# Patient Record
Sex: Male | Born: 1950 | Race: Black or African American | Hispanic: No | Marital: Married | Smoking: Former smoker
Health system: Southern US, Community
[De-identification: ages and names within clinical notes are randomized; demographics above are authoritative.]

## PROBLEM LIST (undated history)

## (undated) DIAGNOSIS — K219 Gastro-esophageal reflux disease without esophagitis: Secondary | ICD-10-CM

## (undated) DIAGNOSIS — R32 Unspecified urinary incontinence: Secondary | ICD-10-CM

## (undated) DIAGNOSIS — N400 Enlarged prostate without lower urinary tract symptoms: Secondary | ICD-10-CM

## (undated) DIAGNOSIS — E7800 Pure hypercholesterolemia, unspecified: Secondary | ICD-10-CM

## (undated) DIAGNOSIS — E119 Type 2 diabetes mellitus without complications: Secondary | ICD-10-CM

## (undated) DIAGNOSIS — G4730 Sleep apnea, unspecified: Secondary | ICD-10-CM

## (undated) DIAGNOSIS — I10 Essential (primary) hypertension: Secondary | ICD-10-CM

## (undated) DIAGNOSIS — M1990 Unspecified osteoarthritis, unspecified site: Secondary | ICD-10-CM

## (undated) HISTORY — PX: HERNIA REPAIR: SHX51

## (undated) HISTORY — DX: Unspecified urinary incontinence: R32

## (undated) HISTORY — DX: Sleep apnea, unspecified: G47.30

## (undated) HISTORY — DX: Type 2 diabetes mellitus without complications: E11.9

## (undated) HISTORY — DX: Benign prostatic hyperplasia without lower urinary tract symptoms: N40.0

## (undated) HISTORY — DX: Gastro-esophageal reflux disease without esophagitis: K21.9

## (undated) HISTORY — DX: Essential (primary) hypertension: I10

## (undated) HISTORY — PX: COLONOSCOPY: SHX174

## (undated) HISTORY — DX: Unspecified osteoarthritis, unspecified site: M19.90

## (undated) HISTORY — DX: Pure hypercholesterolemia, unspecified: E78.00

---

## 1988-01-01 HISTORY — PX: AV FISTULA REPAIR: SHX563

## 2007-01-12 ENCOUNTER — Emergency Department: Payer: Self-pay

## 2010-09-25 ENCOUNTER — Ambulatory Visit: Payer: Self-pay | Admitting: Gastroenterology

## 2010-09-27 LAB — PATHOLOGY REPORT

## 2013-05-12 DIAGNOSIS — N529 Male erectile dysfunction, unspecified: Secondary | ICD-10-CM | POA: Insufficient documentation

## 2013-05-12 DIAGNOSIS — E291 Testicular hypofunction: Secondary | ICD-10-CM | POA: Insufficient documentation

## 2013-10-26 ENCOUNTER — Ambulatory Visit: Payer: Self-pay | Admitting: Gastroenterology

## 2013-10-27 ENCOUNTER — Ambulatory Visit: Payer: Self-pay | Admitting: Gastroenterology

## 2013-10-28 LAB — PATHOLOGY REPORT

## 2014-03-03 ENCOUNTER — Ambulatory Visit: Payer: Self-pay | Admitting: Cardiology

## 2014-03-09 ENCOUNTER — Encounter: Payer: Self-pay | Admitting: *Deleted

## 2014-03-10 ENCOUNTER — Encounter: Payer: Self-pay | Admitting: Cardiology

## 2014-03-10 ENCOUNTER — Ambulatory Visit (INDEPENDENT_AMBULATORY_CARE_PROVIDER_SITE_OTHER): Payer: No Typology Code available for payment source | Admitting: Cardiology

## 2014-03-10 ENCOUNTER — Encounter (INDEPENDENT_AMBULATORY_CARE_PROVIDER_SITE_OTHER): Payer: Self-pay

## 2014-03-10 VITALS — BP 131/88 | HR 68 | Ht 66.0 in | Wt 268.5 lb

## 2014-03-10 DIAGNOSIS — I509 Heart failure, unspecified: Secondary | ICD-10-CM

## 2014-03-10 DIAGNOSIS — R609 Edema, unspecified: Secondary | ICD-10-CM

## 2014-03-10 DIAGNOSIS — R079 Chest pain, unspecified: Secondary | ICD-10-CM

## 2014-03-10 DIAGNOSIS — R0989 Other specified symptoms and signs involving the circulatory and respiratory systems: Secondary | ICD-10-CM

## 2014-03-10 DIAGNOSIS — R0609 Other forms of dyspnea: Secondary | ICD-10-CM

## 2014-03-10 NOTE — Patient Instructions (Signed)
Your physician has requested that you have an echocardiogram. Echocardiography is a painless test that uses sound waves to create images of your heart. It provides your doctor with information about the size and shape of your heart and how well your heart's chambers and valves are working. This procedure takes approximately one hour. There are no restrictions for this procedure.  Your physician recommends that you schedule a follow-up appointment in:  2 weeks after echo

## 2014-03-13 ENCOUNTER — Encounter: Payer: Self-pay | Admitting: Cardiology

## 2014-03-13 DIAGNOSIS — Z6841 Body Mass Index (BMI) 40.0 and over, adult: Secondary | ICD-10-CM

## 2014-03-13 DIAGNOSIS — R0789 Other chest pain: Secondary | ICD-10-CM

## 2014-03-13 DIAGNOSIS — I509 Heart failure, unspecified: Secondary | ICD-10-CM | POA: Insufficient documentation

## 2014-03-13 DIAGNOSIS — R609 Edema, unspecified: Secondary | ICD-10-CM | POA: Insufficient documentation

## 2014-03-13 DIAGNOSIS — R0609 Other forms of dyspnea: Secondary | ICD-10-CM

## 2014-03-13 DIAGNOSIS — R079 Chest pain, unspecified: Secondary | ICD-10-CM | POA: Insufficient documentation

## 2014-03-13 NOTE — Assessment & Plan Note (Signed)
We will need to get to the bottom of his symptoms but also need to work on weight loss. He will need to take an exercise regimen also watch his dietary intake. We will evaluate his feasibility for doing exercise with the current symptoms with a Myoview stress test.

## 2014-03-13 NOTE — Progress Notes (Signed)
PATIENT: Gary Rowland MRN: [id number] DOB: 1951/12/11 PCP: Park PopeHAWKINS,JAMES H, MD  Clinic Note: Chief Complaint  Patient presents with  . OTHER    Ref by PCP c/o chest pain, sob and edema ankles. Meds reviewed verbally with pt.    HPI: Gary Rowland is a 63 y.o. male with a PMH below who presents today for the assessment of chest discomfort and exertional dyspnea along with edema. He has a past medical history of hypertension, type 2 diabetes and dyslipidemia..  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 He says his gain maybe 14 pounds and has noted increasing abdominal girth and leg swelling. He says he occasionally feels short of breath w[place] it but denies feeling short of breath laying down flat. He had an episode back in February where he was working out a treadmill the gym he started to get tightness in his chest with shortness of breath. Since then he's noted the symptoms occur with less activity and more intensity. It is point that he now gets short of breath just simply walking from the heart lot to a grocery store. If he goes any further than that he will start to get tightness in his chest. He denies any irregular or rapid heartbeats/palpitations. No lightheadedness, dizziness, wooziness, syncope/near syncope. No TIA or amaurosis fugax symptoms.  Describes it as chest of being like a burning sensation. The first episode lasted about 30 minutes and then subsided since then the episodes have been shorter. Denies any melena, hematochezia or hematuria. No claudication.  Past Medical History  Diagnosis Date  . Hypercholesteremia   . 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 Unspecified essential hypertension   . Unspecified urinary incontinence   . Type II or unspecified type diabetes mellitus without  mention of complication, not stated as uncontrolled   . Esophageal reflux     Prior Cardiac Evaluation and Past Surgical History: Past Surgical History  Procedure Laterality Date  . Colonoscopy    . Hernia repair    . Av fistula repair      No Known Allergies  Current Outpatient Prescriptions  Medication Sig Dispense Refill  . atorvastatin (LIPITOR) 10 MG tablet Take 10 mg by mouth daily.      Marland Kitchen. doxazosin (CARDURA) 8 MG tablet Take 8 mg by mouth daily.      . finasteride (PROSCAR) 5 MG tablet Take 5 mg by mouth daily.      Marland Kitchen. lisinopril (PRINIVIL,ZESTRIL) 20 MG tablet Take 20 mg by mouth daily.      . metFORMIN (GLUCOPHAGE) 500 MG tablet Take 500 mg by mouth daily with breakfast.      . oxybutynin (DITROPAN-XL) 10 MG 24 hr tablet Take 10 mg by mouth at bedtime.       No current facility-administered medications for this visit.    History   Social History Narrative   He is married. 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 He has some college education. He does note that these are poorly balanced diet. He is a former smoker but quit remotely. Only occasionally drinks alcohol.    family history includes Colon polyps in his mother; Heart attack in his father.  ROS: A comprehensive Review of Systems - Negative except Decreased energy and exercise tolerance with weight gain. Otherwise negative if not noted in history of present illness.  PHYSICAL EXAM BP 131/88  Pulse 68  Ht 5\' 6"  (1.676 m)  Wt 268 lb 8 oz (121.791 kg)  BMI 43.36 kg/m2 General appearance: alert, cooperative, appears stated age, no distress, moderately obese and morbidly obese Neck: no adenopathy, no carotid bruit, supple, symmetrical, trachea midline and Unable to determine any JVD due to body habitus Lungs: clear to auscultation bilaterally, normal percussion bilaterally and Nonlabored, good air movement Heart: normal apical impulse, regular rate and rhythm, S1, S2 normal, no S3 or S4 and Soft 1/6 SEM at RUSB. Abdomen:  soft, non-tender; bowel sounds normal; no masses,  no organomegaly and Obese Extremities: extremities normal, atraumatic, no cyanosis or edema and no ulcers, gangrene or trophic changes Pulses: 2+ and symmetric Skin: Skin color, texture, turgor normal. No rashes or lesions Neurologic: Alert and oriented X 3, normal strength and tone. Normal symmetric reflexes. Normal coordination and gait   Adult ECG Report  Rate: 68 ;  Rhythm: normal sinus rhythm  QRS Axis: 44 ;  PR Interval: 146 msec ;  QRS Duration: 90 msec ; QTc: 366 msec  Voltages: Normal  Conduction Disturbances: none  Other Abnormalities: Q wave noted in lead 3 with flat ST segments in 2 and V2 with mild T. inversions in 3, aVF, V3 through V6.   Narrative Interpretation: Normal sinus rhythm with mild nonspecific ST-T wave abnormalities.  Recent Labs: None Available  ASSESSMENT / PLAN: 63 year old gentleman with a history of diabetes mellitus type 2, hypertension, hyperlipidemia, obesity is a former smoker. He now presents with roughly a month of worsening exertional dyspnea with chest tightness. His most notable symptom for him is low semi-edema.. This is associated with edema. He is at least of intermediate risk of having a cardiac etiology. Therefore warrants evaluation with Myoview stress test. He also has what sounds like a systolic murmur that would be suggestive of aortic valve disease.  Edema He is currently on furosemide as well as an ACE inhibitor and statin. I'm not sure if his dyspnea could be related to his obesity versus potentially due to a cardiac etiology.  In association with PND and exertional dyspnea, cannot exclude cardiac etiology.   Plan: 2-D echocardiogram and Exercise (Treadmill) Myoview For now continue current medications. We'll reassess blood pressure upon Lesion.  Exertional dyspnea In a patient with multiple cardiac risk factors, if any concern for possible cardiac etiology. Plan is to Echocardiogram &   Exercise Myoview. For now continue current regimen as described. Would like to have more data with which to make decision as to to her symptoms. Some of the chest discomfort symptoms do sound a bit musculoskeletal in nature.   CHF (congestive heart failure) Cannot exclude possibility of CHF causing some of the edema. With the chest discomfort she was well, we'll order 2-D echocardiogram to assess EF and filling pressures.  Chest pain with moderate risk for cardiac etiology My initial thought was that we would evaluate with cardiac possible CHF, however when reviewing his symptoms, I was concerned with the presence of the chest discomfort as well. I think it probably best to contact the patient to ensure that a Myoview stress test can also be scheduled as soon as possible.  Severe obesity (BMI >= 40) We will need to get to the bottom of his symptoms but also need to work on weight loss. He will need to take an exercise regimen also watch his dietary intake. We will evaluate his feasibility for doing exercise with the current symptoms with a  Myoview stress test.    Orders Placed This Encounter  Procedures  . EKG 12-Lead    Order Specific Question:  Where should this test be performed    Answer:  [facility]  . 2D Echocardiogram without contrast    Standing Status: Future     Number of Occurrences:      Standing Expiration Date: 03/10/2015    Order Specific Question:  Type of Echo    Answer:  Complete    Order Specific Question:  Where should this test be performed    Answer:  [facility]    Order Specific Question:  Reason for exam-Echo    Answer:  Congestive Heart Failure  428.0   No orders of the defined types were placed in this encounter.    Followup: 2 weeks  Robby Pirani W. Herbie Baltimore, M.D., M.S. Interventional Cardiolgy [facility]

## 2014-03-13 NOTE — Assessment & Plan Note (Signed)
He is currently on furosemide as well as an ACE inhibitor and statin. I'm not sure if his dyspnea could be related to his obesity versus potentially due to a cardiac etiology.  In association with PND and exertional dyspnea, cannot exclude cardiac etiology.   Plan: 2-D echocardiogram and Exercise (Treadmill) Myoview For now continue current medications. We'll reassess blood pressure upon Lesion.

## 2014-03-13 NOTE — Assessment & Plan Note (Signed)
My initial thought was that we would evaluate with cardiac possible CHF, however when reviewing his symptoms, I was concerned with the presence of the chest discomfort as well. I think it probably best to contact the patient to ensure that a Myoview stress test can also be scheduled as soon as possible.

## 2014-03-13 NOTE — Assessment & Plan Note (Signed)
In a patient with multiple cardiac risk factors, if any concern for possible cardiac etiology. Plan is to Echocardiogram &  Exercise Myoview. For now continue current regimen as described. Would like to have more data with which to make decision as to to her symptoms. Some of the chest discomfort symptoms do sound a bit musculoskeletal in nature.

## 2014-03-13 NOTE — Assessment & Plan Note (Signed)
Cannot exclude possibility of CHF causing some of the edema. With the chest discomfort she was well, we'll order 2-D echocardiogram to assess EF and filling pressures.

## 2014-03-17 ENCOUNTER — Telehealth: Payer: Self-pay | Admitting: *Deleted

## 2014-03-17 DIAGNOSIS — R0602 Shortness of breath: Secondary | ICD-10-CM

## 2014-03-17 DIAGNOSIS — R079 Chest pain, unspecified: Secondary | ICD-10-CM

## 2014-03-17 NOTE — Telephone Encounter (Signed)
[facility] MYOVIEW  Your caregiver has ordered a Stress Test with nuclear imaging. The purpose of this test is to evaluate the blood supply to your heart muscle. This procedure is referred to as a "Non-Invasive Stress Test." This is because other than having an IV started in your vein, nothing is inserted or "invades" your body. Cardiac stress tests are done to find areas of poor blood flow to the heart by determining the extent of coronary artery disease (CAD). Some patients exercise on a treadmill, which naturally increases the blood flow to your heart, while others who are  unable to walk on a treadmill due to physical limitations have a pharmacologic/chemical stress agent called Lexiscan . This medicine will mimic walking on a treadmill by temporarily increasing your coronary blood flow.   Please note: these test may take anywhere between 2-4 hours to complete  PLEASE REPORT TO [facility]  THE VOLUNTEERS AT THE FIRST DESK WILL DIRECT YOU WHERE TO GO  Date of Procedure:______________03/23/15_______________  Arrival Time for Procedure:__________0745 am_____________  Instructions regarding medication:   __x__ : Hold diabetes medication morning of procedure   PLEASE NOTIFY THE OFFICE AT LEAST 24 HOURS IN ADVANCE IF YOU ARE UNABLE TO KEEP YOUR APPOINTMENT.  [phone] AND  PLEASE NOT[facility] AT LEAST 24 HOURS IN ADVANCE IF YOU ARE UNABLE TO KEEP YOUR APPOINTMENT. [phone]  How to prepare for your Myoview test:  1. Do not eat or drink after midnight 2. No caffeine for 24 hours prior to test 3. No smoking 24 hours prior to test. 4. Your medication may be taken with water.  If your doctor stopped a medication because of this test, do not take that medication. 5. Ladies, please do not wear dresses.  Skirts or pants are appropriate. Please wear a short sleeve shirt. 6. No perfume, cologne or lotion. 7. Wear comfortable walking shoes. No heels!   Reviewed  Myo instructions with patient. Patient verbalized understanding.

## 2014-03-17 NOTE — Telephone Encounter (Signed)
Myoview ordered after further consideration of his Sx.   See Note for Details.  Marykay LexHARDING,Teion Ballin W, MD

## 2014-03-19 ENCOUNTER — Other Ambulatory Visit: Payer: Self-pay

## 2014-03-19 ENCOUNTER — Other Ambulatory Visit (INDEPENDENT_AMBULATORY_CARE_PROVIDER_SITE_OTHER): Payer: No Typology Code available for payment source

## 2014-03-19 DIAGNOSIS — R079 Chest pain, unspecified: Secondary | ICD-10-CM

## 2014-03-19 DIAGNOSIS — R609 Edema, unspecified: Secondary | ICD-10-CM

## 2014-03-19 DIAGNOSIS — I509 Heart failure, unspecified: Secondary | ICD-10-CM

## 2014-03-19 HISTORY — PX: OTHER SURGICAL HISTORY: SHX169

## 2014-03-20 NOTE — Progress Notes (Signed)
Quick Note:  Echo results: Good news: Essentially normal echocardiogram and normal pump function and normal valve function. No signs to suggest heart attack.. EF: 60-65%. No regional wall motion abnormalities  HARDING,DAVID W, MD  ______ 

## 2014-03-22 ENCOUNTER — Ambulatory Visit: Payer: Self-pay

## 2014-03-22 DIAGNOSIS — R079 Chest pain, unspecified: Secondary | ICD-10-CM

## 2014-04-07 ENCOUNTER — Ambulatory Visit (INDEPENDENT_AMBULATORY_CARE_PROVIDER_SITE_OTHER): Payer: No Typology Code available for payment source | Admitting: Cardiology

## 2014-04-07 ENCOUNTER — Encounter: Payer: Self-pay | Admitting: Cardiology

## 2014-04-07 VITALS — BP 100/66 | HR 100 | Ht 66.0 in | Wt 264.0 lb

## 2014-04-07 DIAGNOSIS — R0989 Other specified symptoms and signs involving the circulatory and respiratory systems: Secondary | ICD-10-CM

## 2014-04-07 DIAGNOSIS — R079 Chest pain, unspecified: Secondary | ICD-10-CM

## 2014-04-07 DIAGNOSIS — R609 Edema, unspecified: Secondary | ICD-10-CM

## 2014-04-07 DIAGNOSIS — R0789 Other chest pain: Secondary | ICD-10-CM

## 2014-04-07 DIAGNOSIS — R0609 Other forms of dyspnea: Secondary | ICD-10-CM

## 2014-04-07 DIAGNOSIS — I951 Orthostatic hypotension: Secondary | ICD-10-CM

## 2014-04-07 DIAGNOSIS — Z6841 Body Mass Index (BMI) 40.0 and over, adult: Secondary | ICD-10-CM

## 2014-04-07 MED ORDER — LISINOPRIL 20 MG PO TABS: 10.0000 mg | ORAL_TABLET | Freq: Every day | ORAL | Status: DC

## 2014-04-07 NOTE — Patient Instructions (Signed)
Your physician has recommended you make the following change in your medication:  Decrease Lisinopril to 10 mg daily while on Cardura  If Cardura is stopped please let us know.   Please talk to PCP about side effects of Doxazosin, Finasteride, and Oxybutinin.   Your physician wants you to follow-up in: 1 year You will receive a reminder letter in the mail two months in advance. If you don't receive a letter, please call our office to schedule the follow-up appointment.

## 2014-04-09 ENCOUNTER — Encounter: Payer: Self-pay | Admitting: Cardiology

## 2014-04-09 DIAGNOSIS — I951 Orthostatic hypotension: Secondary | ICD-10-CM | POA: Insufficient documentation

## 2014-04-09 NOTE — Assessment & Plan Note (Signed)
It is quite likely the main causes of his dyspnea. We talked a long time about importance of dietary modification and exercise..Marland Kitchen

## 2014-04-09 NOTE — Assessment & Plan Note (Signed)
Overall improved, may very well be simply resulted from obesity. He did not have the Myoview ordered, since he is no longer having any chest discomfort, I think we will hold off on the Myoview based on the normal echocardiogram results. We will pursue a more detailed evaluation if his symptoms recur or worsen.

## 2014-04-09 NOTE — Assessment & Plan Note (Signed)
Patient about his blood pressure is only 100 systolic on evaluation today, I suspect he is orthostatic. I recommended he reduce his ACE inhibitor by half. It had been 20. I have asked him to talk to his primary physician because I think the Cardura could very well be the cause. As long as he is taking Cardura, think he needs to get his ACE inhibitor dose in half.  Am also concerned that she is having the decreased visual symptoms which could be a side effect of either doxazosin or finasteride.

## 2014-04-09 NOTE — Assessment & Plan Note (Signed)
He had been on furosemide, and is now not at furosemide but is on medicines for BPH. I'm reluctant to go back to her as mine since he is having issues with nocturia.  No clear cardiac etiology, on echocardiogram. There is no suggestion of elevated point pressures.

## 2014-04-09 NOTE — Progress Notes (Signed)
PATIENT: Gary Rowland MRN: [id number] DOB: 1951/12/02 PCP: Park Pope, MD  Clinic Note: Chief Complaint  Patient presents with  . OTHER    F/u from echo c/o edema ankles. Meds reviewed verbally with pt.     HPI: Gary Rowland is a 63 y.o. male with a PMH below who presents today for followup after initial presentation to assess chest discomfort and exertional dyspnea along with edema. He has a past medical history of hypertension, type 2 diabetes and dyslipidemia.. The main symptom he was noting was dyspnea and edema. Chest discomfort was a secondary concern, therefore we planned to do an echocardiogram initially, then reassess to see if we need to do a Myoview stress test. His echocardiogram was essentially normal with mild concentric hypertrophy an EF of 65% with no significant valve lesions.  Interval History: He returns today stating that his breathing is relatively stable, and he is not really having that much in way of any chest discomfort. He has lost about 4 pounds.  He was started on several medications for nocturia, has noted significant symptoms since starting them including edema, orthostatic hypotension, on decreased libido and sex drive. From a cardiac standpoint cc good doing relatively stable. He still has some mild exertional dyspnea but the chest discomfort is notably improved. He denies any rapid or irregular heartbeats, lightheadedness or dizziness with the exception of the orthostasis. He denies any syncope or near-syncope. No TIA or RCA symptoms. No melena, hematochezia or hematuria. No epistaxis. No claudication.  Past Medical History  Diagnosis Date  . Hypercholesteremia   . 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 Unspecified essential hypertension   . Unspecified urinary incontinence   . Type II or unspecified type diabetes mellitus without mention of complication, not stated as  uncontrolled   . Esophageal reflux     Prior Cardiac Evaluation and Past Surgical History: Past Surgical History  Procedure Laterality Date  . Colonoscopy    . Hernia repair    . Av fistula repair    . 2-d echocardiogram  03/19/2014    Mild concentric LVH with EF 60-65%. No significant valvular lesions and no suggestion of abnormal filling patterns.    No Known Allergies  Current Outpatient Prescriptions  Medication Sig Dispense Refill  . atorvastatin (LIPITOR) 10 MG tablet Take 10 mg by mouth daily.      Marland Kitchen doxazosin (CARDURA) 8 MG tablet Take 8 mg by mouth daily.      . finasteride (PROSCAR) 5 MG tablet Take 5 mg by mouth daily.      Marland Kitchen lisinopril (PRINIVIL,ZESTRIL) 20 MG tablet Take 1 tablets 20mg  total by mouth daily.      . metFORMIN (GLUCOPHAGE) 500 MG tablet Take 500 mg by mouth daily with breakfast.      . oxybutynin (DITROPAN-XL) 10 MG 24 hr tablet Take 10 mg by mouth at bedtime.       No current facility-administered medications for this visit.    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 He has some college education. He does note that these are poorly balanced diet. He is a former smoker but quit remotely. Only occasionally drinks alcohol.    ROS: A comprehensive Review of Systems - Negative except Decreased energy and exercise tolerance with weight gain. Otherwise negative if not noted in history of present illness.  PHYSICAL EXAM BP 100/66  Pulse  100  Ht 5\' 6"  (1.676 m)  Wt 264 lb (119.75 kg)  BMI 42.63 kg/m2 General appearance: alert, cooperative, appears stated age, no distress, moderately obese and morbidly obese Neck: no adenopathy, no carotid bruit, supple, symmetrical, trachea midline and Unable to determine any JVD due to body habitus Lungs: clear to auscultation bilaterally, normal percussion bilaterally and Nonlabored, good air movement Heart: normal apical impulse, regular rate and rhythm, S1, S2 normal, no  S3 or S4 and Soft 1/6 SEM at RUSB. Abdomen: soft, non-tender; bowel sounds normal; no masses,  no organomegaly and Obese Extremities: extremities normal, atraumatic, no cyanosis with trace to 1+ edema; no ulcers, gangrene or trophic changes Pulses: 2+ and symmetric Skin: Skin color, texture, turgor normal. No rashes or lesions Neurologic: Alert and oriented X 3, normal strength and tone. Normal symmetric reflexes. Normal coordination and gait   Adult ECG Report  -- not checked  Recent Labs: None Available; echo results in past medical history  ASSESSMENT / PLAN: 63 year old gentleman with a history of diabetes mellitus type 2, hypertension, hyperlipidemia, obesity is a former smoker.  He was noticing exertional dyspnea with some test tightness as well as lower extremity edema. Initial plan to order a stress test was continued upon his symptoms currently. As of this point, he notably denies any significant chest discomfort episodes since before his last visit. The echocardiogram did not provide any data to suggest a cardiac etiology for his symptoms.   Exertional dyspnea Overall improved, may very well be simply resulted from obesity. He did not have the Myoview ordered, since he is no longer having any chest discomfort, I think we will hold off on the Myoview based on the normal echocardiogram results. We will pursue a more detailed evaluation if his symptoms recur or worsen.  Morbid obesity with BMI of 50.0-59.9, adult It is quite likely the main causes of his dyspnea. We talked a long time about importance of dietary modification and exercise..  Chest pain of unknown etiology At this point, it does not appear to be symptoms related to heart failure as there is no sign of diastolic dysfunction. With no significant wall motion abnormalities, and looking to invoke ischemic etiology either. For now as above we will hold off on stress test unless symptoms recur or worsen.  We'll simply continue  current management.  Orthostatic hypotension Patient about his blood pressure is only 100 systolic on evaluation today, I suspect he is orthostatic. I recommended he reduce his ACE inhibitor by half. It had been 20. I have asked him to talk to his primary physician because I think the Cardura could very well be the cause. As long as he is taking Cardura, think he needs to get his ACE inhibitor dose in half.  Am also concerned that she is having the decreased visual symptoms which could be a side effect of either doxazosin or finasteride.  Edema He had been on furosemide, and is now not at furosemide but is on medicines for BPH. I'm reluctant to go back to her as mine since he is having issues with nocturia.  No clear cardiac etiology, on echocardiogram. There is no suggestion of elevated point pressures.    No orders of the defined types were placed in this encounter.   Meds ordered this encounter  Medications  . lisinopril (PRINIVIL,ZESTRIL) 20 MG tablet    Sig: Take 0.5 tablets (10 mg total) by mouth daily.    Followup: One year, unless symptoms worsen/recur  Lacosta Hargan W.  Herbie Baltimore, M.D., M.S. Interventional Cardiolgy [facility]

## 2014-04-09 NOTE — Assessment & Plan Note (Signed)
At this point, it does not appear to be symptoms related to heart failure as there is no sign of diastolic dysfunction. With no significant wall motion abnormalities, and looking to invoke ischemic etiology either. For now as above we will hold off on stress test unless symptoms recur or worsen.  We'll simply continue current management.

## 2014-12-15 DIAGNOSIS — E785 Hyperlipidemia, unspecified: Secondary | ICD-10-CM | POA: Insufficient documentation

## 2014-12-15 DIAGNOSIS — I10 Essential (primary) hypertension: Secondary | ICD-10-CM | POA: Insufficient documentation

## 2014-12-15 DIAGNOSIS — Z8639 Personal history of other endocrine, nutritional and metabolic disease: Secondary | ICD-10-CM | POA: Insufficient documentation

## 2015-06-15 ENCOUNTER — Ambulatory Visit: Payer: Self-pay

## 2015-08-24 ENCOUNTER — Ambulatory Visit
Admission: RE | Admit: 2015-08-24 | Discharge: 2015-08-24 | Disposition: A | Discharge status: Home / Self Care | Payer: Disability Insurance | Source: Ambulatory Visit | Attending: Ophthalmology | Admitting: Ophthalmology

## 2015-08-24 ENCOUNTER — Other Ambulatory Visit: Payer: Self-pay | Admitting: Ophthalmology

## 2015-08-24 DIAGNOSIS — M79642 Pain in left hand: Secondary | ICD-10-CM | POA: Diagnosis present

## 2015-08-24 DIAGNOSIS — M13811 Other specified arthritis, right shoulder: Secondary | ICD-10-CM | POA: Diagnosis not present

## 2015-08-24 DIAGNOSIS — M1990 Unspecified osteoarthritis, unspecified site: Secondary | ICD-10-CM

## 2015-08-24 DIAGNOSIS — M79641 Pain in right hand: Secondary | ICD-10-CM | POA: Diagnosis present

## 2015-08-24 DIAGNOSIS — M13842 Other specified arthritis, left hand: Secondary | ICD-10-CM | POA: Insufficient documentation

## 2015-08-24 DIAGNOSIS — M13841 Other specified arthritis, right hand: Secondary | ICD-10-CM | POA: Insufficient documentation

## 2015-08-24 DIAGNOSIS — M25511 Pain in right shoulder: Secondary | ICD-10-CM | POA: Diagnosis present

## 2015-08-31 ENCOUNTER — Other Ambulatory Visit: Payer: Self-pay | Admitting: Family Medicine

## 2015-08-31 DIAGNOSIS — R9389 Abnormal findings on diagnostic imaging of other specified body structures: Secondary | ICD-10-CM

## 2015-09-02 ENCOUNTER — Ambulatory Visit
Admission: RE | Admit: 2015-09-02 | Discharge: 2015-09-02 | Disposition: A | Discharge status: Home / Self Care | Payer: No Typology Code available for payment source | Source: Ambulatory Visit | Attending: Family Medicine | Admitting: Family Medicine

## 2015-09-02 DIAGNOSIS — R938 Abnormal findings on diagnostic imaging of other specified body structures: Secondary | ICD-10-CM | POA: Insufficient documentation

## 2015-09-02 DIAGNOSIS — I2510 Atherosclerotic heart disease of native coronary artery without angina pectoris: Secondary | ICD-10-CM | POA: Insufficient documentation

## 2015-09-02 DIAGNOSIS — R9389 Abnormal findings on diagnostic imaging of other specified body structures: Secondary | ICD-10-CM

## 2015-09-02 MED ORDER — IOHEXOL 350 MG/ML SOLN
75.0000 mL | Freq: Once | INTRAVENOUS | Status: AC | PRN
  Administered 2015-09-02: 75 mL via INTRAVENOUS

## 2015-09-26 ENCOUNTER — Telehealth: Payer: Self-pay | Admitting: *Deleted

## 2015-09-26 NOTE — Telephone Encounter (Signed)
lmov to make OD 3m fu

## 2015-11-03 ENCOUNTER — Encounter: Payer: Self-pay | Admitting: Urology

## 2015-11-03 ENCOUNTER — Ambulatory Visit (INDEPENDENT_AMBULATORY_CARE_PROVIDER_SITE_OTHER): Payer: No Typology Code available for payment source | Admitting: Urology

## 2015-11-03 VITALS — BP 123/75 | HR 69 | Ht 66.0 in | Wt 275.6 lb

## 2015-11-03 DIAGNOSIS — N401 Enlarged prostate with lower urinary tract symptoms: Secondary | ICD-10-CM | POA: Diagnosis not present

## 2015-11-03 DIAGNOSIS — R351 Nocturia: Secondary | ICD-10-CM | POA: Diagnosis not present

## 2015-11-03 DIAGNOSIS — N138 Other obstructive and reflux uropathy: Secondary | ICD-10-CM | POA: Insufficient documentation

## 2015-11-03 LAB — BLADDER SCAN AMB NON-IMAGING: SCAN RESULT: 36

## 2015-11-03 NOTE — Progress Notes (Signed)
11/03/2015 9:39 AM   Gary Rowland 12/13/1951 [id number]  Referring provider: Cyndie MullGloria Patricia Santayana, DO [address] [place], [place] [id number]  Chief Complaint  Patient presents with  . Benign Prostatic Hypertrophy    [facility]  . Nocturia    HPI: Patient is a 64 year old African-American male who is a previous patient of Dr.Stoioff who presents today to establish care with a local urologist.  His major complaint today is nocturia 3-4 times a night.  He had been on testosterone cypionate in the past, but he has not had an ejection since 2013. He is not interested in restarting this medication at this time.  BPH WITH LUTS His IPSS score today is 17, which is moderate lower urinary tract symptomatology. He is terrible with his quality life due to his urinary symptoms. His PVR is 36 mL.  His major complaint today nocturia x 3-4.  He has had these symptoms for several years.  He denies any dysuria, hematuria or suprapubic pain.   He currently taking finasteride and doxazosin.  He also denies any recent fevers, chills, nausea or vomiting.  He does not have a family history of PCa.      IPSS      11/03/15 0900       International Prostate Symptom Score   How often have you had the sensation of not emptying your bladder? More than half the time     How often have you had to urinate less than every two hours? Almost always     How often have you found you stopped and started again several times when you urinated? About half the time     How often have you found it difficult to postpone urination? Less than half the time     How often have you had a weak urinary stream? Not at All     How often have you had to strain to start urination? Not at All     How many times did you typically get up at night to urinate? 3 Times     Total IPSS Score 17     Quality of Life due to urinary symptoms   If you were to spend the rest of  your life with your urinary condition just the way it is now how would you feel about that? Terrible        Score:  1-7 Mild 8-19 Moderate 20-35 Severe    PMH: Past Medical History  Diagnosis Date  . Hypercholesteremia   . 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 Unspecified essential hypertension   . Unspecified urinary incontinence   . Esophageal reflux   . Arthritis   . Diabetes (HCC)   . Sleep apnea     Surgical History: Past Surgical History  Procedure Laterality Date  . Colonoscopy    . Hernia repair    . Av fistula repair  1989  . 2-d echocardiogram  03/19/2014    Mild concentric LVH with EF 60-65%. No significant valvular lesions and no suggestion of abnormal filling patterns.    Home Medications:    Medication List       This list is accurate as of: 11/03/15  9:39 AM.  Always use your most recent med list.               amLODipine 10 MG tablet  Commonly known as:  NORVASC  Take by mouth.     atorvastatin 10 MG tablet  Commonly known as:  LIPITOR  Take 10 mg by mouth daily.     doxazosin 8 MG tablet  Commonly known as:  CARDURA  Take 8 mg by mouth daily.     finasteride 5 MG tablet  Commonly known as:  PROSCAR  Take 5 mg by mouth daily.     lisinopril 20 MG tablet  Commonly known as:  PRINIVIL,ZESTRIL  Take 0.5 tablets (10 mg total) by mouth daily.     losartan 100 MG tablet  Commonly known as:  COZAAR  Take by mouth.     metFORMIN 500 MG tablet  Commonly known as:  GLUCOPHAGE  Take 500 mg by mouth daily with breakfast.     metoprolol succinate 100 MG 24 hr tablet  Commonly known as:  TOPROL-XL  Take 100 mg by mouth.     oxybutynin 10 MG 24 hr tablet  Commonly known as:  DITROPAN-XL  Take 10 mg by mouth at bedtime.     testosterone cypionate 200 MG/ML injection  Commonly known as:  DEPOTESTOSTERONE CYPIONATE  Frequency:Q2W   Dosage:0.0     Instructions:   Note:Dose: 200 MG/ML        Allergies: No Known Allergies  Family History: Family History  Problem Relation Age of Onset  . Colon polyps Mother   . Heart attack Father   . Kidney disease Neg Hx   . Prostate cancer Neg Hx     Social History:  reports that he has quit smoking. He does not have any smokeless tobacco history on file. He reports that he drinks alcohol. He reports that he does not use illicit drugs.  ROS: UROLOGY Frequent Urination?: Yes Hard to postpone urination?: Yes Burning/pain with urination?: No Get up at night to urinate?: Yes Leakage of urine?: Yes Urine stream starts and stops?: Yes Trouble starting stream?: No Do you have to strain to urinate?: Yes Blood in urine?: No Urinary tract infection?: No Sexually transmitted disease?: No Injury to kidneys or bladder?: No Painful intercourse?: No Weak stream?: No Erection problems?: Yes Penile pain?: No  Gastrointestinal Nausea?: No Vomiting?: No Indigestion/heartburn?: No Diarrhea?: No Constipation?: No  Constitutional Fever: No Night sweats?: No Weight loss?: No Fatigue?: No  Skin Skin rash/lesions?: No Itching?: No  Eyes Blurred vision?: No Double vision?: No  Ears/Nose/Throat Sore throat?: No Sinus problems?: No  Hematologic/Lymphatic Swollen glands?: No Easy bruising?: No  Cardiovascular Leg swelling?: No Chest pain?: No  Respiratory Cough?: No Shortness of breath?: No  Endocrine Excessive thirst?: No  Musculoskeletal Back pain?: No Joint pain?: No  Neurological Headaches?: No Dizziness?: No  Psychologic Depression?: No Anxiety?: No  Physical Exam: BP 123/75 mmHg  Pulse 69  Ht  (1.676 m)  Wt 275 lb 9.6 oz (125.011 kg)  BMI 44.50 kg/m2  Constitutional: Well nourished. Alert and oriented, No acute distress. HEENT: [place] AT, moist mucus membranes. Trachea midline, no masses. Cardiovascular: No clubbing, cyanosis, or edema. Respiratory: Normal  respiratory effort, no increased work of breathing. GI: Abdomen is soft, non tender, non distended, no abdominal masses. Liver and spleen not palpable.  No hernias appreciated.  Stool sample for occult testing is not indicated.   GU: No CVA tenderness.  No bladder fullness or masses.  Patient with circumcised phallus. Foreskin easily retracted  Urethral meatus is patent.  No penile discharge. No penile lesions or rashes. Scrotum without lesions, cysts,  rashes and/or edema.  Testicles are located scrotally bilaterally. No masses are appreciated in the testicles. Left and right epididymis are normal. Rectal: Patient with  normal sphincter tone. Anus and perineum without scarring or rashes. No rectal masses are appreciated. Prostate is approximately ~50 grams (could not palpated entire gland due to buttocks tissue), no nodules are appreciated. Seminal vesicles are normal. Skin: No rashes, bruises or suspicious lesions. Lymph: No cervical or inguinal adenopathy. Neurologic: Grossly intact, no focal deficits, moving all 4 extremities. Psychiatric: Normal mood and affect.  Laboratory Data: PSA History:  0.45 ng/mL on 05/18/2015  Pertinent Imaging: Results for ARIEL, WINGROVE (MRN [id number]) as of 11/03/2015 09:26  Ref. Range 11/03/2015 09:07  Scan Result Unknown 36    Assessment & Plan:    1. BPH (benign prostatic hyperplasia) with LUTS:  Patient's IPSS score is 17/6  His PVR 36 mL.  His DRE demonstrates benign enlargement.  He is on maximal medical therapy with finasteride and doxazosin.   He is still experiencing moderate LUTS.  Since he is emptying his bladder, this may be more of an overactive bladder issue.  I will start Myrbetriq 50 mg daily.  He will follow up in 3 weeks for a  PVR and an IPSS score.    2. Nocturia:   I explained to the patient that nocturia is often multi-factorial and difficult to treat.  Sleeping disorders, heart conditions and peripheral vascular disease, diabetes,   enlarged prostate or urethral stricture causing bladder outlet obstruction and/or certain medications.  The patient does not drink caffeine or alcohol.  He is also avoiding fluids after 6:00 in the evening and voiding just prior to bedtime.  He has been diagnosed with sleep apnea and sleeps with a CPAP machine, but the machine itself for repairs at this time.  Since he had an adequate response with the oxybutynin but could not tolerate dry mouth, I will have him try Myrbetriq 50 mg daily.  I have advised the patient of the side effects of Myrbetriq, such as: elevation in BP, urinary retention and/or HA.  He will RTC in 3 weeks for a PVR and an IPSS score.    - BLADDER SCAN AMB NON-IMAGING   Return in about 3 weeks (around 11/24/2015) for IPSS and PVR.  Michiel Cowboy, PA-C  [facility] [address], [address] [place], [place] [address] [phone]

## 2015-11-29 ENCOUNTER — Encounter: Payer: Self-pay | Admitting: Urology

## 2015-11-29 ENCOUNTER — Ambulatory Visit (INDEPENDENT_AMBULATORY_CARE_PROVIDER_SITE_OTHER): Payer: No Typology Code available for payment source | Admitting: Urology

## 2015-11-29 VITALS — BP 124/68 | HR 80 | Ht 66.0 in | Wt 279.3 lb

## 2015-11-29 DIAGNOSIS — N401 Enlarged prostate with lower urinary tract symptoms: Secondary | ICD-10-CM

## 2015-11-29 DIAGNOSIS — R351 Nocturia: Secondary | ICD-10-CM

## 2015-11-29 DIAGNOSIS — N138 Other obstructive and reflux uropathy: Secondary | ICD-10-CM

## 2015-11-29 LAB — BLADDER SCAN AMB NON-IMAGING: SCAN RESULT: 28

## 2015-11-29 MED ORDER — OXYBUTYNIN CHLORIDE 5 MG PO TABS: 5.0000 mg | ORAL_TABLET | Freq: Three times a day (TID) | ORAL | Status: DC

## 2015-11-29 NOTE — Progress Notes (Signed)
9:18 AM   Gary Rowland [birth year] [id number]  Referring provider: Cyndie MullGloria Patricia Santayana, DO [address] [place], [place] [id number]  Chief Complaint  Patient presents with  . Benign Prostatic Hypertrophy    3 week recheck  . Nocturia    HPI: Patient is a 64 year old African-American male who is a previous patient of Dr.Stoioff who presents today to discuss his response to the Myrbetriq 50 mg.  His major complaint today is still  nocturia 3-4 times a night.  He had been on testosterone cypionate in the past, but he has not had an ejection since 2013. He is not interested in restarting this medication at this time.  BPH WITH LUTS His IPSS score today is 13, which is moderate lower urinary tract symptomatology. He is mostly dissatisfied with his quality life due to his urinary symptoms. His PVR is 28 mL.  His major complaint today nocturia x 3-4.  The Myrbetriq 50 mg nightly did not provide relief.  With further questioning, he is not using his CPAP machine consistently and it needs to be serviced.  He has had these symptoms for several years.  He denies any dysuria, hematuria or suprapubic pain.   He currently taking finasteride and doxazosin.  He also denies any recent fevers, chills, nausea or vomiting.  He does not have a family history of PCa.      IPSS      11/03/15 0900 11/29/15 0800     International Prostate Symptom Score   How often have you had the sensation of not emptying your bladder? More than half the time About half the time    How often have you had to urinate less than every two hours? Almost always Almost always    How often have you found you stopped and started again several times when you urinated? About half the time Not at All    How often have you found it difficult to postpone urination? Less than half the time Not at All    How often have you had a weak urinary stream? Not at All Not at All    How often have you had to strain to start urination? Not  at All Not at All    How many times did you typically get up at night to urinate? 3 Times 5 Times    Total IPSS Score 17 13    Quality of Life due to urinary symptoms   If you were to spend the rest of your life with your urinary condition just the way it is now how would you feel about that? Terrible Mostly Disatisfied       Score:  1-7 Mild 8-19 Moderate 20-35 Severe    PMH: Past Medical History  Diagnosis Date  . Hypercholesteremia   . 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 Unspecified essential hypertension   . Unspecified urinary incontinence   . Esophageal reflux   . Arthritis   . Diabetes (HCC)   . Sleep apnea     Surgical History: Past Surgical History  Procedure Laterality Date  . Colonoscopy    . Hernia repair    . Av fistula repair  1989  . 2-d echocardiogram  03/19/2014    Mild concentric LVH with EF 60-65%. No significant valvular lesions and no suggestion of abnormal filling patterns.    Home Medications:    Medication List  This list is accurate as of: 11/29/15  9:18 AM.  Always use your most recent med list.               amLODipine 10 MG tablet  Commonly known as:  NORVASC  Take by mouth.     atorvastatin 10 MG tablet  Commonly known as:  LIPITOR  Take 10 mg by mouth daily.     doxazosin 8 MG tablet  Commonly known as:  CARDURA  Take 8 mg by mouth daily.     finasteride 5 MG tablet  Commonly known as:  PROSCAR  Take 5 mg by mouth daily.     lisinopril 20 MG tablet  Commonly known as:  PRINIVIL,ZESTRIL  Take 0.5 tablets (10 mg total) by mouth daily.     losartan 100 MG tablet  Commonly known as:  COZAAR  Take by mouth.     metFORMIN 500 MG tablet  Commonly known as:  GLUCOPHAGE  Take 500 mg by mouth daily with breakfast.     metoprolol succinate 100 MG 24 hr tablet  Commonly known as:  TOPROL-XL  Take 100 mg by mouth.     oxybutynin 5 MG  tablet  Commonly known as:  DITROPAN  Take 1 tablet (5 mg total) by mouth 3 (three) times daily.     testosterone cypionate 200 MG/ML injection  Commonly known as:  DEPOTESTOSTERONE CYPIONATE  Frequency:Q2W   Dosage:0.0     Instructions:  Note:Dose: 200 MG/ML        Allergies: No Known Allergies  Family History: Family History  Problem Relation Age of Onset  . Colon polyps Mother   . Heart attack Father   . Kidney disease Neg Hx   . Prostate cancer Neg Hx     Social History:  reports that he has quit smoking. He does not have any smokeless tobacco history on file. He reports that he drinks alcohol. He reports that he does not use illicit drugs.  ROS: UROLOGY Frequent Urination?: Yes Hard to postpone urination?: No Burning/pain with urination?: No Get up at night to urinate?: Yes Leakage of urine?: No Urine stream starts and stops?: No Trouble starting stream?: No Do you have to strain to urinate?: No Blood in urine?: No Urinary tract infection?: No Sexually transmitted disease?: No Injury to kidneys or bladder?: No Painful intercourse?: No Weak stream?: No Erection problems?: Yes Penile pain?: No  Gastrointestinal Nausea?: No Vomiting?: No Indigestion/heartburn?: No Diarrhea?: No Constipation?: No  Constitutional Fever: No Night sweats?: No Weight loss?: No Fatigue?: No  Skin Skin rash/lesions?: No Itching?: No  Eyes Blurred vision?: Yes Double vision?: No  Ears/Nose/Throat Sore throat?: No Sinus problems?: No  Hematologic/Lymphatic Swollen glands?: No Easy bruising?: No  Cardiovascular Leg swelling?: Yes Chest pain?: No  Respiratory Cough?: No Shortness of breath?: No  Endocrine Excessive thirst?: No  Musculoskeletal Back pain?: No Joint pain?: Yes  Neurological Headaches?: No Dizziness?: No  Psychologic Depression?: No Anxiety?: No  Physical Exam: BP 124/68 mmHg  Pulse 80  Ht  (1.676 m)  Wt 279 lb 4.8 oz (126.69  kg)  BMI 45.10 kg/m2  Constitutional: Well nourished. Alert and oriented, No acute distress. HEENT: [place] AT, moist mucus membranes. Trachea midline, no masses. Cardiovascular: No clubbing, cyanosis, or edema. Respiratory: Normal respiratory effort, no increased work of breathing. Skin: No rashes, bruises or suspicious lesions. Lymph: No cervical or inguinal adenopathy. Neurologic: Grossly intact, no focal deficits, moving all 4 extremities. Psychiatric: Normal mood and  affect.  Laboratory Data: PSA History:  0.45 ng/mL on 05/18/2015  Pertinent Imaging: Results for VALDIS, BEVILL (MRN [id number]) as of 11/29/2015 09:08  Ref. Range 11/29/2015 08:57  Scan Result Unknown 28   Assessment & Plan:    1. BPH (benign prostatic hyperplasia) with LUTS:  Patient's IPSS score is 13/4.  His PVR 28 mL.  His DRE demonstrates benign enlargement.  He is on maximal medical therapy with finasteride and doxazosin.   He is still experiencing moderate LUTS.  Since he is emptying his bladder, this may be more of an overactive bladder issue.  He did not respond to the Myrbetriq.  He's had some success with oxybutynin in the past.  I will send that medication to his pharmacy.   He will follow up in 6 weeks for a  PVR and an IPSS score.    2. Nocturia:   I explained to the patient that nocturia is often multi-factorial and difficult to treat.  Sleeping disorders, heart conditions and peripheral vascular disease, diabetes,  enlarged prostate or urethral stricture causing bladder outlet obstruction and/or certain medications.  The patient does not drink caffeine or alcohol.  He is also avoiding fluids after 6:00 in the evening and voiding just prior to bedtime.  He has been diagnosed with sleep apnea and has not been sleeping with a CPAP machine.  It is in need of repairs.  I have encouraged him to contact the company again to get the machine serviced.   Since he had an adequate response with the oxybutynin, I will re  prescribe that medication.    He will RTC in 6 weeks for a PVR and an IPSS score.    - BLADDER SCAN AMB NON-IMAGING   Return in about 6 weeks (around 01/10/2016) for IPSS score and PVR.  Michiel Cowboy, PA-C  [facility] [address] [place], [place] [address] [phone]

## 2015-12-03 ENCOUNTER — Other Ambulatory Visit: Payer: Self-pay | Admitting: Family Medicine

## 2015-12-09 ENCOUNTER — Other Ambulatory Visit: Payer: Self-pay | Admitting: Family Medicine

## 2015-12-15 ENCOUNTER — Other Ambulatory Visit: Payer: Self-pay | Admitting: Family Medicine

## 2016-01-10 ENCOUNTER — Encounter: Payer: Self-pay | Admitting: Urology

## 2016-01-10 ENCOUNTER — Ambulatory Visit (INDEPENDENT_AMBULATORY_CARE_PROVIDER_SITE_OTHER): Payer: BLUE CROSS/BLUE SHIELD | Admitting: Urology

## 2016-01-10 VITALS — BP 143/69 | HR 79 | Ht 66.0 in | Wt 276.7 lb

## 2016-01-10 DIAGNOSIS — N401 Enlarged prostate with lower urinary tract symptoms: Secondary | ICD-10-CM | POA: Diagnosis not present

## 2016-01-10 DIAGNOSIS — Z8639 Personal history of other endocrine, nutritional and metabolic disease: Secondary | ICD-10-CM | POA: Diagnosis not present

## 2016-01-10 DIAGNOSIS — R351 Nocturia: Secondary | ICD-10-CM | POA: Diagnosis not present

## 2016-01-10 DIAGNOSIS — N138 Other obstructive and reflux uropathy: Secondary | ICD-10-CM

## 2016-01-10 LAB — BLADDER SCAN AMB NON-IMAGING: Scan Result: 15

## 2016-01-10 NOTE — Progress Notes (Signed)
10:06 AM   Gary SitesRaymond L Rowland [birth year] [id number]  Referring provider: Cyndie MullGloria Patricia Santayana, DO [address] [place], [place] [id number]  Chief Complaint  Patient presents with  . Benign Prostatic Hypertrophy    6 week recheck  . Nocturia    HPI: Patient is a 65 year old African-American male who is a previous patient of Dr.Stoioff who presents today to discuss his response to the oxybutynin 5 mg three time daily for his nocturia.   He also has a history of hypogonadism and BPH with LUTS.     Nocturia Patient has had his CPAP machine serviced and has been using it consistently over the last week.  He also was changed from amlodipine to HCTZ which has reduced his peripheral edema.  He is also taking oxybutynin 5 mg tid.  He is not experiencing any untoward side effects.  He is now getting up two times nightly rather than 4 times nightly.    History of hypogonadism He had been on testosterone cypionate in the past, but he has not had an ejection since 2013. He is not interested in restarting this medication at this time.  BPH WITH LUTS His IPSS score today is 4, which is mild lower urinary tract symptomatology. He is mostly satisfied with his quality life due to his urinary symptoms. His PVR is 15 mL.  His nocturia has reduced from 4 times nightly to 2 times nightly.  He denies any dysuria, hematuria or suprapubic pain.   He currently taking finasteride and doxazosin.  He also denies any recent fevers, chills, nausea or vomiting.  He does not have a family history of PCa.      IPSS      11/29/15 0800 01/10/16 0900     International Prostate Symptom Score   How often have you had the sensation of not emptying your bladder? About half the time Less than 1 in 5    How often have you had to urinate less than every two hours? Almost always Less than 1 in 5 times    How often have you found you stopped and started again several times when you urinated? Not at All Not at All    How  often have you found it difficult to postpone urination? Not at All Not at All    How often have you had a weak urinary stream? Not at All Not at All    How often have you had to strain to start urination? Not at All Not at All    How many times did you typically get up at night to urinate? 5 Times 2 Times    Total IPSS Score 13 4    Quality of Life due to urinary symptoms   If you were to spend the rest of your life with your urinary condition just the way it is now how would you feel about that? Mostly Disatisfied Mostly Satisfied       Score:  1-7 Mild 8-19 Moderate 20-35 Severe    PMH: Past Medical History  Diagnosis Date  . Hypercholesteremia   . 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 Unspecified essential hypertension   . Unspecified urinary incontinence   . Esophageal reflux   . Arthritis   . Diabetes (HCC)   . Sleep apnea     Surgical History: Past Surgical History  Procedure Laterality Date  . Colonoscopy    . Hernia repair    .  Av fistula repair  1989  . 2-d echocardiogram  03/19/2014    Mild concentric LVH with EF 60-65%. No significant valvular lesions and no suggestion of abnormal filling patterns.    Home Medications:    Medication List       This list is accurate as of: 01/10/16 10:06 AM.  Always use your most recent med list.               amLODipine 10 MG tablet  Commonly known as:  NORVASC  Take by mouth. Reported on 01/10/2016     atorvastatin 10 MG tablet  Commonly known as:  LIPITOR  TAKE 1 TABLET EVERY EVENING     doxazosin 8 MG tablet  Commonly known as:  CARDURA  TAKE 1 TABLET DAILY     finasteride 5 MG tablet  Commonly known as:  PROSCAR  Take 5 mg by mouth daily.     hydrochlorothiazide 25 MG tablet  Commonly known as:  HYDRODIURIL  Take by mouth.     losartan 100 MG tablet  Commonly known as:  COZAAR  TAKE 1 TABLET DAILY     metFORMIN 500 MG  tablet  Commonly known as:  GLUCOPHAGE  Take 500 mg by mouth daily with breakfast.     metoprolol succinate 100 MG 24 hr tablet  Commonly known as:  TOPROL-XL  Take 100 mg by mouth.     oxybutynin 5 MG tablet  Commonly known as:  DITROPAN  Take 1 tablet (5 mg total) by mouth 3 (three) times daily.     testosterone cypionate 200 MG/ML injection  Commonly known as:  DEPOTESTOSTERONE CYPIONATE  Reported on 01/10/2016        Allergies: No Known Allergies  Family History: Family History  Problem Relation Age of Onset  . Colon polyps Mother   . Heart attack Father   . Kidney disease Neg Hx   . Prostate cancer Neg Hx     Social History:  reports that he has quit smoking. He does not have any smokeless tobacco history on file. He reports that he drinks alcohol. He reports that he does not use illicit drugs.  ROS: UROLOGY Frequent Urination?: No Hard to postpone urination?: No Burning/pain with urination?: No Get up at night to urinate?: No Leakage of urine?: No Urine stream starts and stops?: No Trouble starting stream?: No Do you have to strain to urinate?: No Blood in urine?: No Urinary tract infection?: No Sexually transmitted disease?: No Injury to kidneys or bladder?: No Painful intercourse?: No Weak stream?: No Erection problems?: Yes Penile pain?: No  Gastrointestinal Nausea?: No Vomiting?: No Indigestion/heartburn?: No Diarrhea?: No Constipation?: No  Constitutional Fever: No Night sweats?: No Weight loss?: No Fatigue?: No  Skin Skin rash/lesions?: No Itching?: No  Eyes Blurred vision?: No Double vision?: No  Ears/Nose/Throat Sore throat?: No Sinus problems?: No  Hematologic/Lymphatic Swollen glands?: No Easy bruising?: No  Cardiovascular Leg swelling?: Yes Chest pain?: No  Respiratory Cough?: No Shortness of breath?: No  Endocrine Excessive thirst?: No  Musculoskeletal Back pain?: No Joint pain?:  No  Neurological Headaches?: No Dizziness?: No  Psychologic Depression?: No Anxiety?: No  Physical Exam: BP 143/69 mmHg  Pulse 79  Ht 5\' 6"  (1.676 m)  Wt 276 lb 11.2 oz (125.51 kg)  BMI 44.68 kg/m2  Constitutional: Well nourished. Alert and oriented, No acute distress. HEENT: [place] AT, moist mucus membranes. Trachea midline, no masses. Cardiovascular: No clubbing, cyanosis, or edema. Respiratory: Normal respiratory effort, no  increased work of breathing. Skin: No rashes, bruises or suspicious lesions. Lymph: No cervical or inguinal adenopathy. Neurologic: Grossly intact, no focal deficits, moving all 4 extremities. Psychiatric: Normal mood and affect.  Laboratory Data: PSA History:  0.45 ng/mL on 05/18/2015  Pertinent Imaging: Results for RYOSUKE, ERICKSEN (MRN [id number]) as of 11/29/2015 09:08  Ref. Range 11/29/2015 08:57  Scan Result Unknown 28   Results for KELBY, ADELL (MRN [id number]) as of 01/10/2016 10:06  Ref. Range 01/10/2016 10:03  Scan Result Unknown 15   Assessment & Plan:    1. BPH (benign prostatic hyperplasia) with LUTS:  Patient's IPSS score is 4/2.  His PVR 15 mL.  He had a poor response to Myrbetriq.  He has noticed reduction in his nocturia from 4 times nightly to 2 times nightly.  He will continue the oxybutynin, finasteride and doxazosin.  He will follow up in May for I PSS score, PVR, exam and PSA.  2. Nocturia:   Patient has noticed an improvement in his nocturia since the use of a CPAP machine, changes in his blood pressure medication and the addition of the oxybutynin.  He will continue these measures.  He will follow up in May for I PSS score, PVR, exam and PSA.  - BLADDER SCAN AMB NON-IMAGING  3. History of hypogonadism:   He had been on testosterone cypionate in the past, but he has not had an ejection since 2013. He is not interested in restarting this medication at this time.  Return in about 4 months (around 05/09/2016) for IPSS, PVR and  exam.  Michiel Cowboy, [facility]  [facility] [address], [address] [place], [place] [address] [phone]

## 2016-02-21 DIAGNOSIS — E7800 Pure hypercholesterolemia, unspecified: Secondary | ICD-10-CM | POA: Insufficient documentation

## 2016-02-21 DIAGNOSIS — E119 Type 2 diabetes mellitus without complications: Secondary | ICD-10-CM | POA: Insufficient documentation

## 2016-02-21 DIAGNOSIS — G4733 Obstructive sleep apnea (adult) (pediatric): Secondary | ICD-10-CM | POA: Insufficient documentation

## 2016-02-21 DIAGNOSIS — Z9989 Dependence on other enabling machines and devices: Secondary | ICD-10-CM

## 2016-03-02 ENCOUNTER — Other Ambulatory Visit: Payer: Self-pay | Admitting: Family Medicine

## 2016-05-02 ENCOUNTER — Other Ambulatory Visit: Payer: BLUE CROSS/BLUE SHIELD

## 2016-05-09 ENCOUNTER — Ambulatory Visit: Payer: BLUE CROSS/BLUE SHIELD | Admitting: Urology

## 2016-05-09 ENCOUNTER — Other Ambulatory Visit: Payer: BLUE CROSS/BLUE SHIELD

## 2016-08-24 ENCOUNTER — Encounter: Payer: Self-pay | Admitting: Nurse Practitioner

## 2016-08-24 ENCOUNTER — Encounter: Payer: Self-pay | Admitting: Cardiology

## 2016-08-24 NOTE — Telephone Encounter (Signed)
Technician from [facility] called, states that pt stress test is positve and needs an appointment.   This encounter was created in error - please disregard.  This encounter was created in error - please disregard.  This encounter was created in error - please disregard.

## 2016-08-27 ENCOUNTER — Ambulatory Visit: Payer: No Typology Code available for payment source | Admitting: Nurse Practitioner

## 2016-10-01 ENCOUNTER — Encounter: Payer: Self-pay | Admitting: Urology

## 2016-10-01 ENCOUNTER — Ambulatory Visit (INDEPENDENT_AMBULATORY_CARE_PROVIDER_SITE_OTHER): Payer: BLUE CROSS/BLUE SHIELD | Admitting: Urology

## 2016-10-01 VITALS — BP 114/68 | HR 74 | Ht 68.0 in | Wt 262.7 lb

## 2016-10-01 DIAGNOSIS — R351 Nocturia: Secondary | ICD-10-CM | POA: Diagnosis not present

## 2016-10-01 DIAGNOSIS — N401 Enlarged prostate with lower urinary tract symptoms: Secondary | ICD-10-CM

## 2016-10-01 DIAGNOSIS — N138 Other obstructive and reflux uropathy: Secondary | ICD-10-CM | POA: Diagnosis not present

## 2016-10-01 LAB — URINALYSIS, COMPLETE
Bilirubin, UA: NEGATIVE
Glucose, UA: NEGATIVE
Ketones, UA: NEGATIVE
Leukocytes, UA: NEGATIVE
NITRITE UA: NEGATIVE
PH UA: 6.5 (ref 5.0–7.5)
PROTEIN UA: NEGATIVE
RBC, UA: NEGATIVE
Specific Gravity, UA: 1.015 (ref 1.005–1.030)
UUROB: 0.2 mg/dL (ref 0.2–1.0)

## 2016-10-01 LAB — BLADDER SCAN AMB NON-IMAGING: SCAN RESULT: 0

## 2016-10-01 LAB — MICROSCOPIC EXAMINATION
BACTERIA UA: NONE SEEN
WBC UA: NONE SEEN /HPF (ref 0–?)

## 2016-10-01 MED ORDER — OXYBUTYNIN CHLORIDE 5 MG PO TABS: 5.0000 mg | ORAL_TABLET | Freq: Three times a day (TID) | ORAL | 12 refills | Status: AC

## 2016-10-01 NOTE — Progress Notes (Signed)
10:49 AM   Gary Rowland 09/17/1951 [id number]  Referring provider: Cyndie Mull, DO [address] [place], [place] [address]  Chief Complaint  Patient presents with  . Benign Prostatic Hypertrophy    follow up been 9 months patient was supposed to follow up at 4 mo  . Nocturia    HPI: Patient is a 65 year old African-American male who is a previous patient of Dr.Stoioff who presents today for an overdue appointment for BPH with LUTS and nocturia.      Nocturia Patient has had his CPAP machine serviced and has been using it consistently.  He also was changed from amlodipine to HCTZ which has reduced his peripheral edema.  He is taking only one oxybutynin 5 mg daily,   He is not experiencing any untoward side effects.  He is getting up 2 to 3 times nightly.      BPH WITH LUTS His IPSS score today is 20, which is severe lower urinary tract symptomatology.  He is unhappy with his quality life due to his urinary symptoms.   His PVR is 0 mL at today's exam.  His previous IPSS score was 4/2.  His previous PVR was 15 mL.  His nocturia has returned to 4 times nightly.  He denies any dysuria, hematuria or suprapubic pain.   He currently taking finasteride and doxazosin.  He is only taking the oxybutynin 5 mg once daily.  He also denies any recent fevers, chills, nausea or vomiting.  He does not have a family history of PCa.      IPSS    Row Name 10/01/16 1000         International Prostate Symptom Score   How often have you had the sensation of not emptying your bladder? More than half the time     How often have you had to urinate less than every two hours? Almost always     How often have you found you stopped and started again several times when you urinated? More than half the time     How often have you found it difficult to postpone urination? Less than half the time     How often have you had a weak urinary stream? Less than 1 in 5 times     How often have you had  to strain to start urination? Less than 1 in 5 times     How many times did you typically get up at night to urinate? 3 Times     Total IPSS Score 20       Quality of Life due to urinary symptoms   If you were to spend the rest of your life with your urinary condition just the way it is now how would you feel about that? Unhappy        Score:  1-7 Mild 8-19 Moderate 20-35 Severe    PMH: Past Medical History:  Diagnosis Date  . Arthritis   . Diabetes (HCC)   . Esophageal reflux   . Hypercholesteremia   . Sleep apnea   . Unspecified essential hypertension   . 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 Unspecified urinary incontinence     Surgical History: Past Surgical History:  Procedure Laterality Date  . 2-D echocardiogram  03/19/2014   Mild concentric LVH with EF 60-65%. No significant valvular lesions and no suggestion of abnormal filling patterns.  . AV FISTULA REPAIR  1989  . COLONOSCOPY    . HERNIA REPAIR      Home Medications:    Medication List       Accurate as of 10/01/16 10:49 AM. Always use your most recent med list.          atorvastatin 10 MG tablet Commonly known as:  LIPITOR TAKE 1 TABLET EVERY EVENING   doxazosin 8 MG tablet Commonly known as:  CARDURA TAKE 1 TABLET DAILY   finasteride 5 MG tablet Commonly known as:  PROSCAR Take 5 mg by mouth daily.   hydrochlorothiazide 25 MG tablet Commonly known as:  HYDRODIURIL Take by mouth.   losartan 100 MG tablet Commonly known as:  COZAAR TAKE 1 TABLET DAILY   metFORMIN 500 MG tablet Commonly known as:  GLUCOPHAGE Take 500 mg by mouth daily with breakfast.   metoprolol succinate 100 MG 24 hr tablet Commonly known as:  TOPROL-XL Take 100 mg by mouth.   oxybutynin 5 MG tablet Commonly known as:  DITROPAN Take 1 tablet (5 mg total) by mouth 3 (three) times daily.   testosterone cypionate 200 MG/ML  injection Commonly known as:  DEPOTESTOSTERONE CYPIONATE Frequency:Q2W   Dosage:0.0     Instructions:  Note:Dose: 200 MG/ML       Allergies: No Known Allergies  Family History: Family History  Problem Relation Age of Onset  . Colon polyps Mother   . Heart attack Father   . Kidney disease Neg Hx   . Prostate cancer Neg Hx     Social History:  reports that he has quit smoking. He has never used smokeless tobacco. He reports that he drinks alcohol. He reports that he does not use drugs.  ROS: UROLOGY Frequent Urination?: Yes Hard to postpone urination?: No Burning/pain with urination?: No Get up at night to urinate?: Yes Leakage of urine?: No Urine stream starts and stops?: No Trouble starting stream?: No Do you have to strain to urinate?: No Blood in urine?: No Urinary tract infection?: No Sexually transmitted disease?: No Injury to kidneys or bladder?: No Painful intercourse?: No Weak stream?: No Erection problems?: Yes Penile pain?: Yes  Gastrointestinal Nausea?: No Vomiting?: No Indigestion/heartburn?: Yes Diarrhea?: Yes Constipation?: No  Constitutional Fever: No Night sweats?: No Weight loss?: No Fatigue?: No  Skin Skin rash/lesions?: No Itching?: No  Eyes Blurred vision?: Yes Double vision?: No  Ears/Nose/Throat Sore throat?: No Sinus problems?: No  Hematologic/Lymphatic Swollen glands?: No Easy bruising?: No  Cardiovascular Leg swelling?: Yes Chest pain?: No  Respiratory Cough?: No Shortness of breath?: No  Endocrine Excessive thirst?: No  Musculoskeletal Back pain?: No Joint pain?: Yes  Neurological Headaches?: No Dizziness?: No  Psychologic Depression?: No Anxiety?: No  Physical Exam: BP 114/68   Pulse 74   Ht 5\' 8"  (1.727 m)   Wt 262 lb 11.2 oz (119.2 kg)   BMI 39.94 kg/m   Constitutional: Well nourished. Alert and oriented, No acute distress. HEENT: [place] AT, moist mucus membranes. Trachea midline, no  masses. Cardiovascular: No clubbing, cyanosis, or edema. Respiratory: Normal respiratory effort, no increased work of breathing. GI: Abdomen is soft, non tender, non distended, no abdominal masses. Liver and spleen not palpable.  No hernias appreciated.  Stool sample for occult testing is not indicated.   GU: No CVA tenderness.  No bladder fullness or masses.  Patient with circumcised phallus.   Urethral meatus is patent.  No penile discharge. No penile lesions or rashes. Scrotum without lesions, cysts, rashes and/or edema.  Testicles are  located scrotally bilaterally. No masses are appreciated in the testicles. Left and right epididymis are normal. Rectal: Patient with  normal sphincter tone. Anus and perineum without scarring or rashes. No rectal masses are appreciated. Prostate is approximately 35 grams, no nodules are appreciated. Seminal vesicles are normal. Skin: No rashes, bruises or suspicious lesions. Lymph: No cervical or inguinal adenopathy. Neurologic: Grossly intact, no focal deficits, moving all 4 extremities. Psychiatric: Normal mood and affect.  Laboratory Data: PSA History:  0.45 ng/mL on 05/18/2015  Pertinent Imaging: Results for Gary Rowland, Gary Rowland (MRN [id number]) as of 10/01/2016 10:36  Ref. Range 10/01/2016 10:27  Scan Result Unknown 0    Assessment & Plan:    1. BPH with LUTS  - IPSS score is 20/5, it is worsening  - Continue conservative management, avoiding bladder irritants and timed voiding's  - Continue doxazosin 8 mg daily and finasteride 5 mg daily  - RTC in 6 weeks for IPSS and PVR   2. Nocturia  - Patient has noticed an increase in his nocturia recently  - he is using the CPAP machine  - increase the oxybutynin from 5 mg daily to 5 mg tid  - RTC in 6 weeks for IPSS and PVR  - BLADDER SCAN AMB NON-IMAGING  Return in about 6 weeks (around 11/12/2016) for IPSS and PVR.  Michiel CowboySHANNON Kenlie Seki, PA-C  [facility] [address], [address] [place], [place] [address] [phone]

## 2016-10-02 ENCOUNTER — Telehealth: Payer: Self-pay

## 2016-10-02 LAB — PSA: Prostate Specific Ag, Serum: 0.5 ng/mL (ref 0.0–4.0)

## 2016-10-02 NOTE — Telephone Encounter (Signed)
Spoke with pt in reference to PSA results. Pt voiced understanding.  

## 2016-10-02 NOTE — Telephone Encounter (Signed)
-----   Message from Harle BattiestShannon A McGowan, PA-C sent at 10/02/2016  1:20 PM EDT ----- Please tell patient that his PSA is stable.  We will see him in six weeks.

## 2016-11-12 ENCOUNTER — Ambulatory Visit: Payer: BLUE CROSS/BLUE SHIELD | Admitting: Urology

## 2017-03-27 ENCOUNTER — Emergency Department
Admission: EM | Admit: 2017-03-27 | Discharge: 2017-03-27 | Disposition: A | Discharge status: Home / Self Care | Payer: Medicare Other | Attending: Student in an Organized Health Care Education/Training Program | Admitting: Student in an Organized Health Care Education/Training Program

## 2017-03-27 ENCOUNTER — Encounter: Payer: Self-pay | Admitting: Emergency Medicine

## 2017-03-27 DIAGNOSIS — Z7984 Long term (current) use of oral hypoglycemic drugs: Secondary | ICD-10-CM | POA: Diagnosis not present

## 2017-03-27 DIAGNOSIS — E119 Type 2 diabetes mellitus without complications: Secondary | ICD-10-CM | POA: Diagnosis not present

## 2017-03-27 DIAGNOSIS — Z87891 Personal history of nicotine dependence: Secondary | ICD-10-CM | POA: Diagnosis not present

## 2017-03-27 DIAGNOSIS — Z79899 Other long term (current) drug therapy: Secondary | ICD-10-CM | POA: Insufficient documentation

## 2017-03-27 DIAGNOSIS — J111 Influenza due to unidentified influenza virus with other respiratory manifestations: Secondary | ICD-10-CM | POA: Insufficient documentation

## 2017-03-27 DIAGNOSIS — I110 Hypertensive heart disease with heart failure: Secondary | ICD-10-CM | POA: Insufficient documentation

## 2017-03-27 DIAGNOSIS — I509 Heart failure, unspecified: Secondary | ICD-10-CM | POA: Diagnosis not present

## 2017-03-27 DIAGNOSIS — R05 Cough: Secondary | ICD-10-CM | POA: Diagnosis present

## 2017-03-27 MED ORDER — OSELTAMIVIR PHOSPHATE 75 MG PO CAPS: 75.0000 mg | ORAL_CAPSULE | Freq: Two times a day (BID) | ORAL | 0 refills | Status: AC

## 2017-03-27 MED ORDER — PSEUDOEPH-BROMPHEN-DM 30-2-10 MG/5ML PO SYRP: 10.0000 mL | ORAL_SOLUTION | Freq: Four times a day (QID) | ORAL | 0 refills | Status: AC | PRN

## 2017-03-27 MED ORDER — CETIRIZINE HCL 10 MG PO TABS: 10.0000 mg | ORAL_TABLET | Freq: Every day | ORAL | 0 refills | Status: AC

## 2017-03-27 MED ORDER — FLUTICASONE PROPIONATE 50 MCG/ACT NA SUSP: 1.0000 | Freq: Two times a day (BID) | NASAL | 0 refills | Status: AC

## 2017-03-27 NOTE — ED Provider Notes (Signed)
[facility] Emergency Department Provider Note  ____________________________________________  Time seen: Approximately 10:46 PM  I have reviewed the triage vital signs and the nursing notes.   HISTORY  Chief Complaint Generalized Body Aches and Nasal Congestion    HPI Gary Rowland is a 66 y.o. male who presents emergency department complaining of nasal congestion, coughing, body aches, fevers and chills. Patient states that symptoms began rather rapidlyyesterday. Patient has been trying Tylenol and Mucinex with minimal relief. He denies any headache, visual changes, neck pain, chest pain, shortness of breath, abdominal pain, nausea or vomiting.   Past Medical History:  Diagnosis Date  . Arthritis   . Diabetes (HCC)   . Esophageal reflux   . Hypercholesteremia   . Sleep apnea   . Unspecified essential hypertension   . 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 Unspecified urinary incontinence     Patient Active Problem List   Diagnosis Date Noted  . History of hypogonadism 01/10/2016  . BPH with obstruction/lower urinary tract symptoms 11/03/2015  . Nocturia 11/03/2015  . Orthostatic hypotension 04/09/2014  . Chest pain of unknown etiology 03/13/2014  . Edema 03/13/2014  . CHF (congestive heart failure) (HCC) 03/13/2014  . Exertional dyspnea 03/13/2014  . Severe obesity (BMI >= 40) (HCC) 03/13/2014    Class: Diagnosis of  . Morbid obesity with BMI of 50.0-59.9, adult (HCC) 03/13/2014    Past Surgical History:  Procedure Laterality Date  . 2-D echocardiogram  03/19/2014   Mild concentric LVH with EF 60-65%. No significant valvular lesions and no suggestion of abnormal filling patterns.  . AV FISTULA REPAIR  1989  . COLONOSCOPY    . HERNIA REPAIR      Prior to Admission medications   Medication Sig Start Date End Date Taking? Authorizing Provider  atorvastatin  (LIPITOR) 10 MG tablet TAKE 1 TABLET EVERY EVENING 03/05/16   Janeann ForehandJames H Hawkins Jr., MD  brompheniramine-pseudoephedrine-DM 30-2-10 MG/5ML syrup Take 10 mLs by mouth 4 (four) times daily as needed. 03/27/17   Delorise RoyalsJonathan D Heavin Sebree, PA-C  cetirizine (ZYRTEC) 10 MG tablet Take 1 tablet (10 mg total) by mouth daily. 03/27/17   Delorise RoyalsJonathan D Gabriell Daigneault, PA-C  doxazosin (CARDURA) 8 MG tablet TAKE 1 TABLET DAILY 12/15/15   Janeann ForehandJames H Hawkins Jr., MD  finasteride (PROSCAR) 5 MG tablet Take 5 mg by mouth daily.    Historical Provider, MD  fluticasone (FLONASE) 50 MCG/ACT nasal spray Place 1 spray into both nostrils 2 (two) times daily. 03/27/17   Delorise RoyalsJonathan D Tishawna Larouche, PA-C  hydrochlorothiazide (HYDRODIURIL) 25 MG tablet Take by mouth. 12/22/15 12/21/16  Historical Provider, MD  losartan (COZAAR) 100 MG tablet TAKE 1 TABLET DAILY 12/09/15   Janeann ForehandJames H Hawkins Jr., MD  metFORMIN (GLUCOPHAGE) 500 MG tablet Take 500 mg by mouth daily with breakfast.    Historical Provider, MD  metoprolol succinate (TOPROL-XL) 100 MG 24 hr tablet Take 100 mg by mouth.    Historical Provider, MD  oseltamivir (TAMIFLU) 75 MG capsule Take 1 capsule (75 mg total) by mouth 2 (two) times daily. 03/27/17   Delorise RoyalsJonathan D Symir Mah, PA-C  oxybutynin (DITROPAN) 5 MG tablet Take 1 tablet (5 mg total) by mouth 3 (three) times daily. 10/01/16   Harle BattiestShannon A McGowan, PA-C  testosterone cypionate (DEPOTESTOSTERONE CYPIONATE) 200 MG/ML injection Frequency:Q2W   Dosage:0.0     Instructions:  Note:Dose: 200 MG/ML 09/12/12   Historical Provider, MD    Allergies Patient has  no known allergies.  Family History  Problem Relation Age of Onset  . Colon polyps Mother   . Heart attack Father   . Kidney disease Neg Hx   . Prostate cancer Neg Hx     Social History Social History  Substance Use Topics  . Smoking status: Former Games developer  . Smokeless tobacco: Never Used     Comment: quit 1993  . Alcohol use 0.0 oz/week     Review of Systems  Constitutional: Positive  fever/chills Eyes: No visual changes. No discharge ENT: Positive for nasal congestion. Cardiovascular: no chest pain. Respiratory: Positive cough. No SOB. Gastrointestinal: No abdominal pain.  No nausea, no vomiting.  No diarrhea.  No constipation. Musculoskeletal: Positive for generalized myalgias Skin: Negative for rash, abrasions, lacerations, ecchymosis. Neurological: Negative for headaches, focal weakness or numbness. 10-point ROS otherwise negative.  ____________________________________________   PHYSICAL EXAM:  VITAL SIGNS: ED Triage Vitals  Enc Vitals Group     BP 03/27/17 2132 (!) 172/75     Pulse Rate 03/27/17 2132 72     Resp 03/27/17 2132 18     Temp 03/27/17 2132 100 F (37.8 C)     Temp Source 03/27/17 2132 Oral     SpO2 03/27/17 2132 95 %     Weight --      Height --      Head Circumference --      Peak Flow --      Pain Score 03/27/17 2135 8     Pain Loc --      Pain Edu? --      Excl. in GC? --      Constitutional: Alert and oriented. Well appearing and in no acute distress. Eyes: Conjunctivae are normal. PERRL. EOMI. Head: Atraumatic. ENT:      Ears: EACs and TMs are unremarkable bilaterally.      Nose: Moderate congestion/rhinnorhea.      Mouth/Throat: Mucous membranes are moist. Pharynx is mildly erythematous but nonedematous. Uvula is midline. Neck: No stridor. Neck is supple with full range of motion Hematological/Lymphatic/Immunilogical: Diffuse, mobile, nontender anterior cervical lymphadenopathy. Cardiovascular: Normal rate, regular rhythm. Normal S1 and S2.  Good peripheral circulation. Respiratory: Normal respiratory effort without tachypnea or retractions. Lungs CTAB. Good air entry to the bases with no decreased or absent breath sounds. Musculoskeletal: Full range of motion to all extremities. No gross deformities appreciated. Neurologic:  Normal speech and language. No gross focal neurologic deficits are appreciated.  Skin:  Skin is  warm, dry and intact. No rash noted. Psychiatric: Mood and affect are normal. Speech and behavior are normal. Patient exhibits appropriate insight and judgement.   ____________________________________________   LABS (all labs ordered are listed, but only abnormal results are displayed)  Labs Reviewed - No data to display ____________________________________________  EKG   ____________________________________________  RADIOLOGY   No results found.  ____________________________________________    PROCEDURES  Procedure(s) performed:    Procedures    Medications - No data to display   ____________________________________________   INITIAL IMPRESSION / ASSESSMENT AND PLAN / ED COURSE  Pertinent labs & imaging results that were available during my care of the patient were reviewed by me and considered in my medical decision making (see chart for details).  Review of the [place] CSRS was performed in accordance of the [facility] prior to dispensing any controlled drugs.     Patient's diagnosis is consistent with influenza. Patient's symptoms are consistent with influenza. At this time, testing is not obtained. No indication  of acute bacterial infection including sinusitis, strep, pneumonia.. Patient will be discharged home with prescriptions for symptom control to include Flonase, Zyrtec, cough medication as well as prescribed Tamiflu. Patient is to follow up with primary care as needed or otherwise directed. Patient is given ED precautions to return to the ED for any worsening or new symptoms.     ____________________________________________  FINAL CLINICAL IMPRESSION(S) / ED DIAGNOSES  Final diagnoses:  Influenza      NEW MEDICATIONS STARTED DURING THIS VISIT:  New Prescriptions   BROMPHENIRAMINE-PSEUDOEPHEDRINE-DM 30-2-10 MG/5ML SYRUP    Take 10 mLs by mouth 4 (four) times daily as needed.   CETIRIZINE (ZYRTEC) 10 MG TABLET    Take 1 tablet (10 mg total) by mouth  daily.   FLUTICASONE (FLONASE) 50 MCG/ACT NASAL SPRAY    Place 1 spray into both nostrils 2 (two) times daily.   OSELTAMIVIR (TAMIFLU) 75 MG CAPSULE    Take 1 capsule (75 mg total) by mouth 2 (two) times daily.        This chart was dictated using voice recognition software/Dragon. Despite best efforts to proofread, errors can occur which can change the meaning. Any change was purely unintentional.    Racheal Patches, PA-C 03/27/17 2323    Willy Eddy, MD 03/27/17 [phone]

## 2017-03-27 NOTE — ED Triage Notes (Signed)
Patient with complaint of congestion and body aches that started yesterday.

## 2017-03-27 NOTE — ED Notes (Signed)
Pt took mucinex and benadryl OTC for symptoms with no relief.

## 2017-07-26 IMAGING — CR DG SHOULDER 2+V*R*
1 series · 3 of 3 positions shown · non-contrast
Comparison: None in PACs

CLINICAL DATA: Disability determination; right shoulder pain for 4
years, previous cortisone injection, no known injury

EXAM:
RIGHT SHOULDER - 2+ VIEW

[Series 1: w shoulder external right · 0.14mm/px · 3 of 3 slices shown]
[im 1/3]
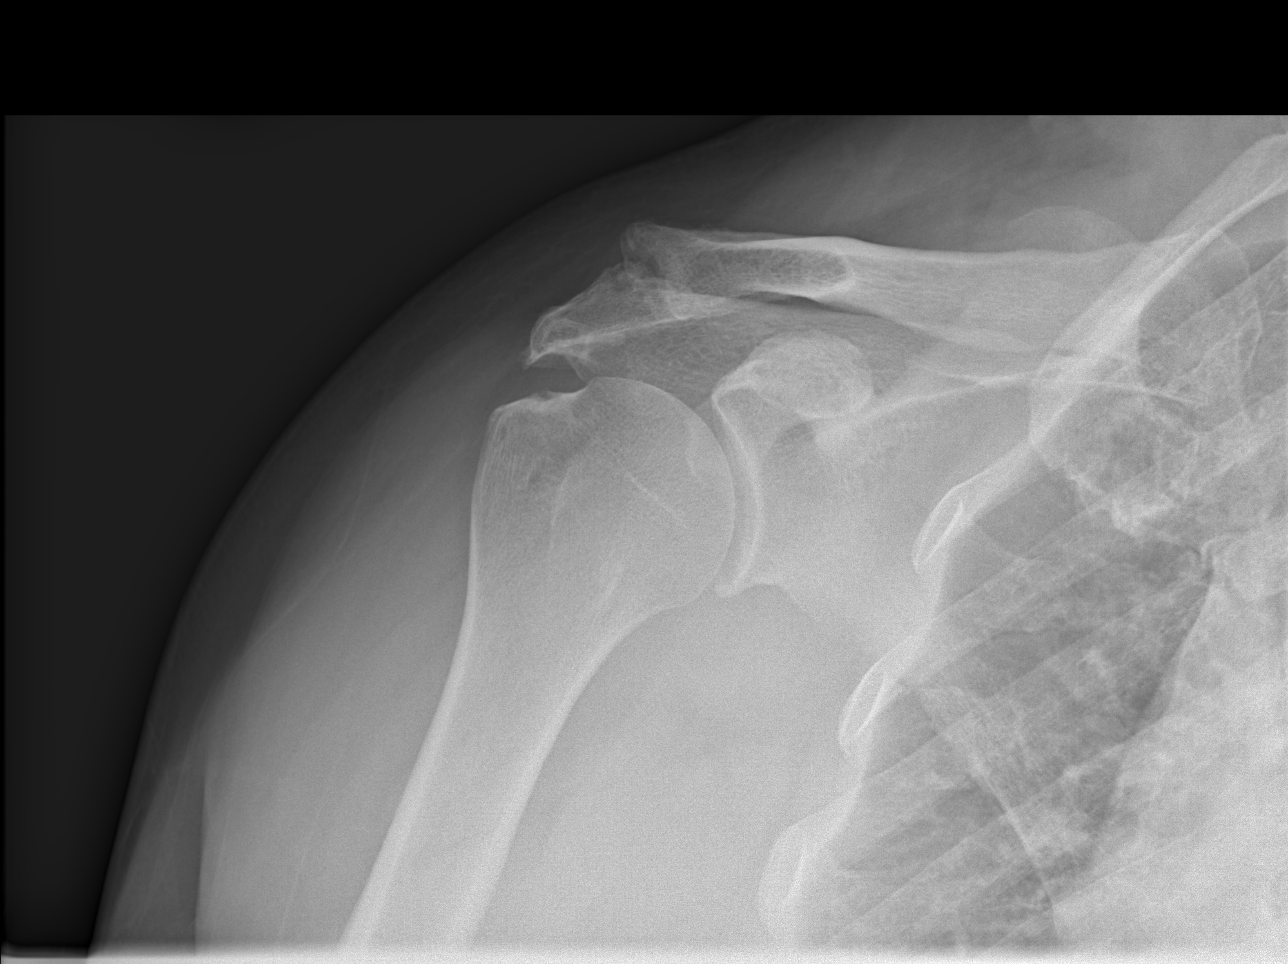
[im 2/3]
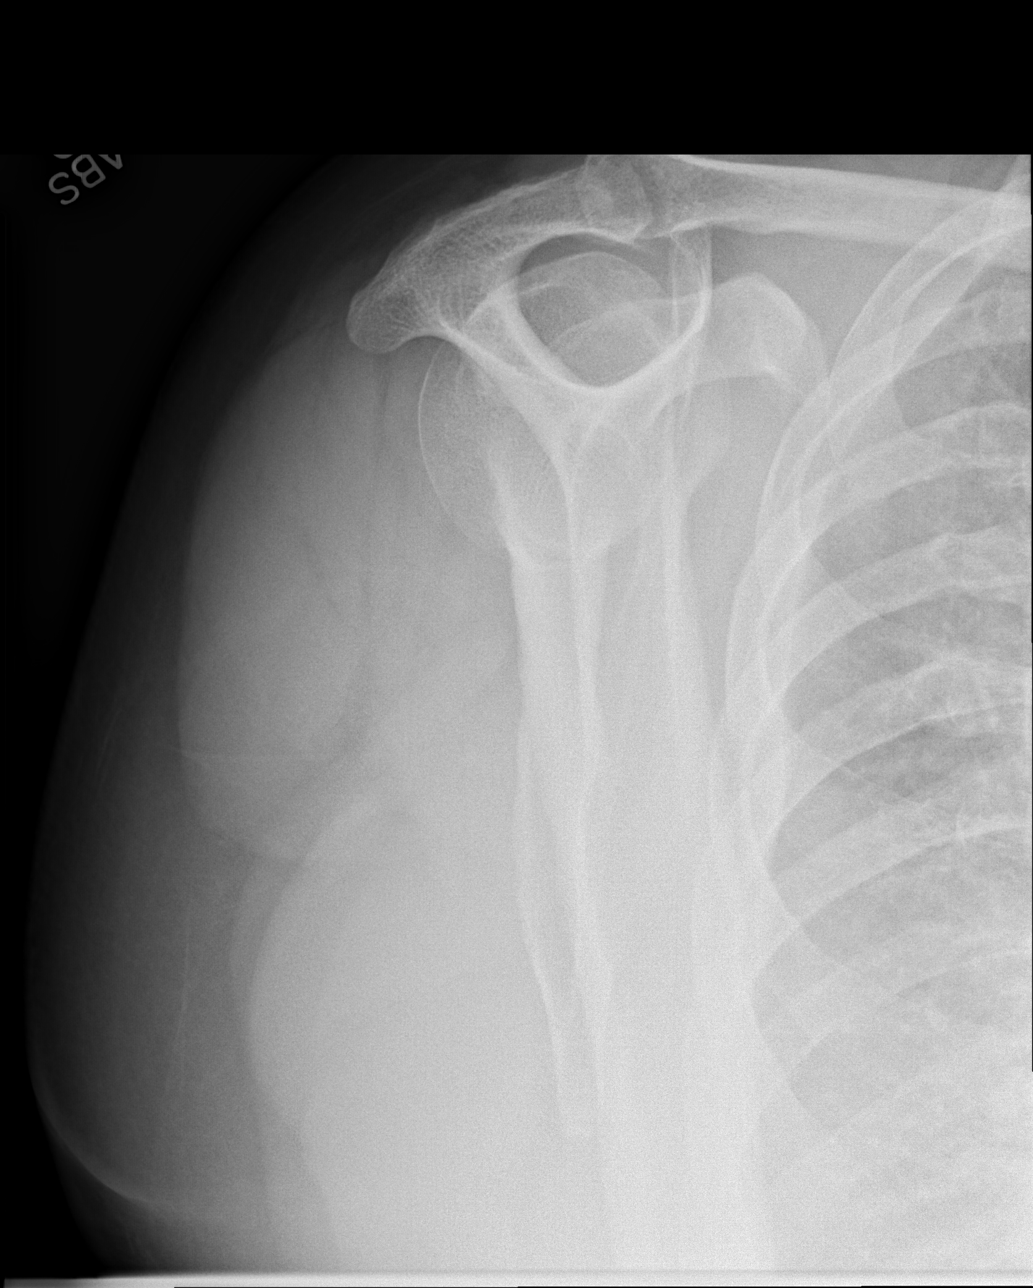
[im 3/3]
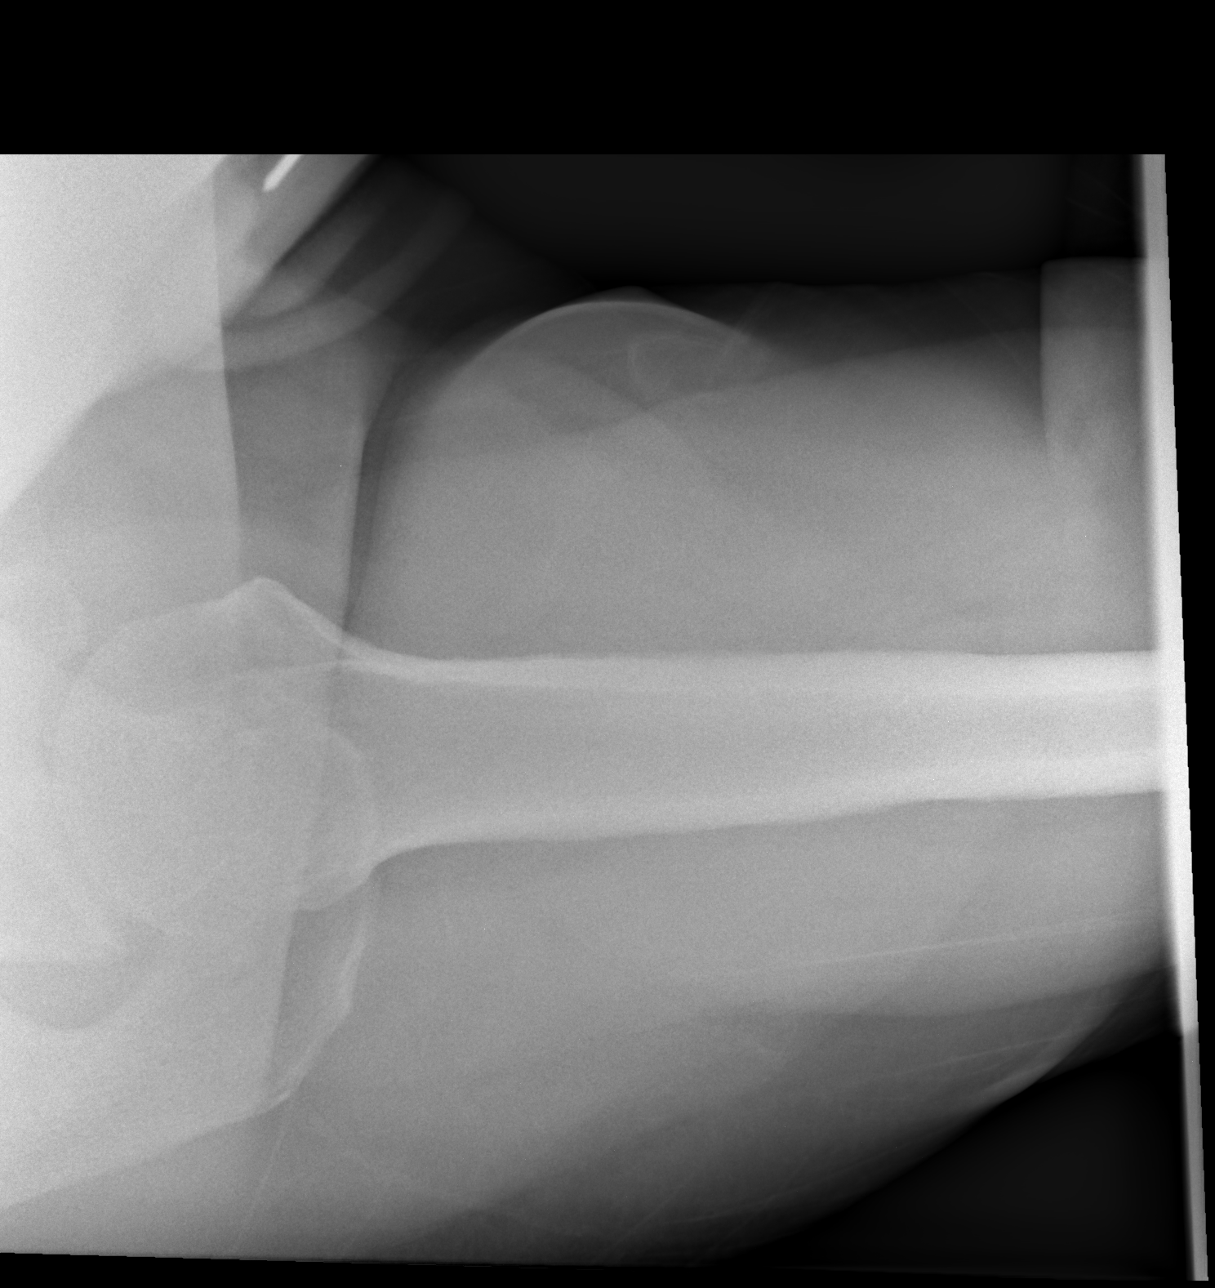

[3 of 3 positions shown; findings below may reference images not displayed]

FINDINGS: The bones are adequately mineralized. There is no significant
abnormality of the glenohumeral joint space. There is moderate
narrowing and irregularity of the AC joint. There is a small
subacromial spur. The observed portions of the right clavicle and
upper right ribs are normal.
IMPRESSION: There degenerative changes of the AC joint. There is also a
subacromial spur. This may be impacting the rotator cuff.

## 2017-08-04 IMAGING — CT CT CHEST W/ CM
2 of 3 series · 15 of 36 positions shown, 18 images · IV contrast (omnipaque)
Comparison: Priors.

CLINICAL DATA: 63-year-old male with cough, congestion and sinus
cold for the past 2 weeks.

EXAM:
CT CHEST WITH CONTRAST
TECHNIQUE: Multidetector CT imaging of the chest was performed during
intravenous contrast administration.
CONTRAST:  75mL OMNIPAQUE IOHEXOL 350 MG/ML SOLN

[Series 2: routine chest with · axial · 0.69mm/px · z∈[-768,-508]mm · 12 of 62 slices shown, 15 images]
[im 5/62  mediastinal]
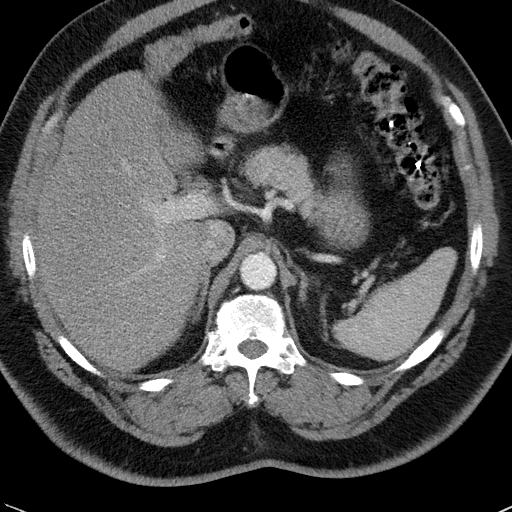
[im 5/62  lung]
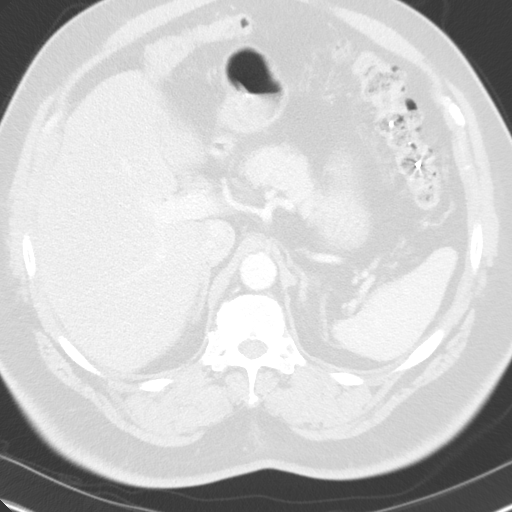
[im 10/62  lung]
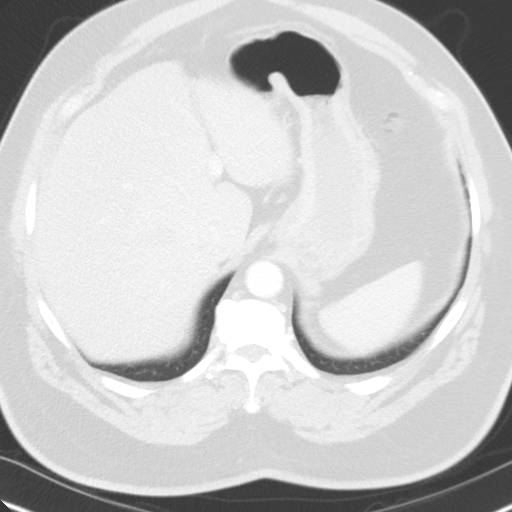
[im 14/62  lung]
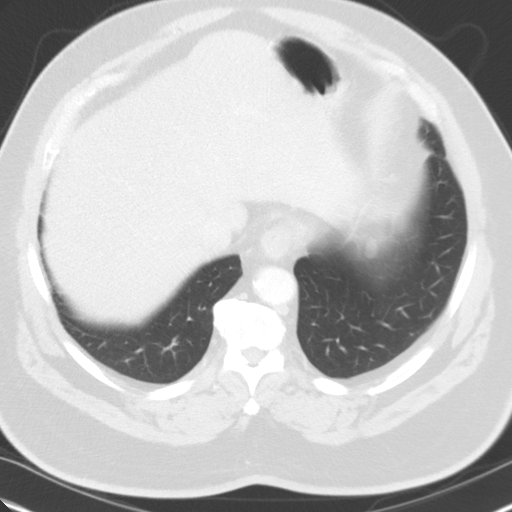
[im 19/62  lung]
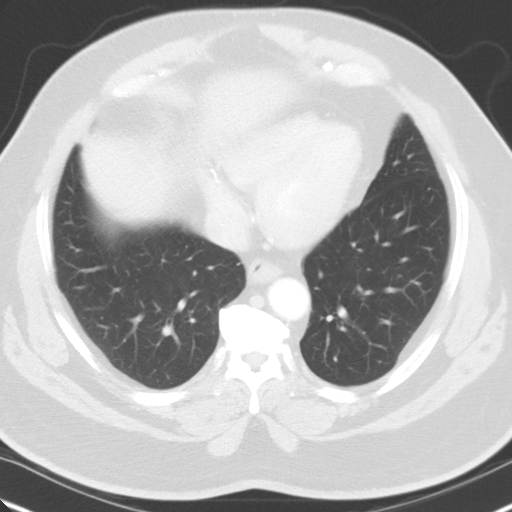
[im 23/62  mediastinal]
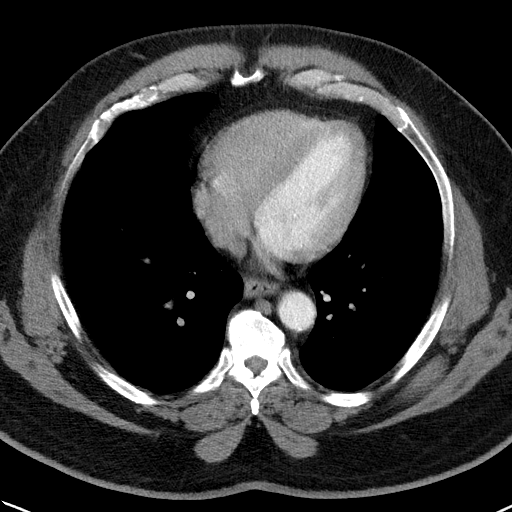
[im 23/62  lung]
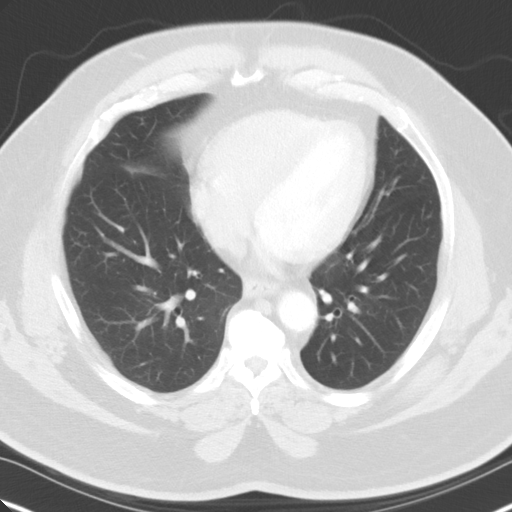
[im 28/62  lung]
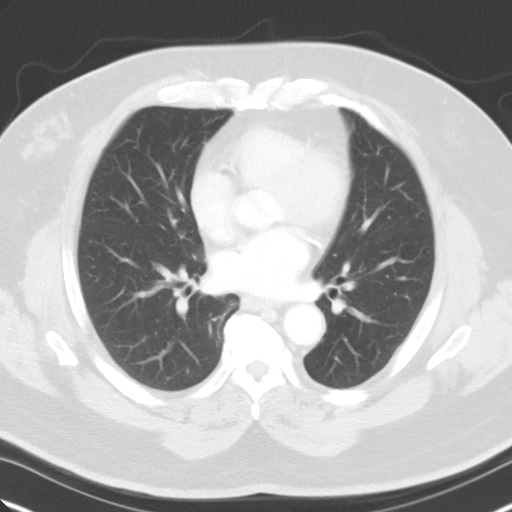
[im 34/62  lung]
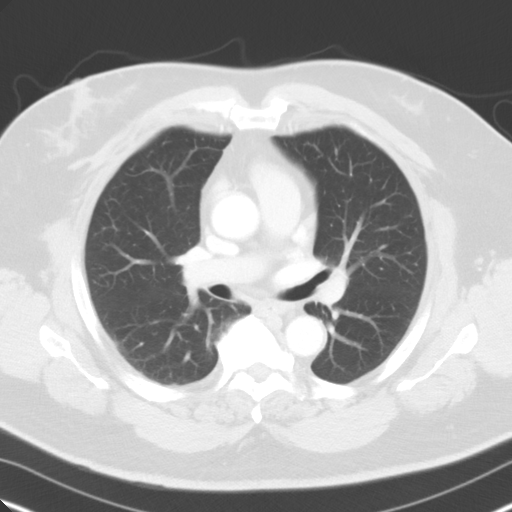
[im 39/62  lung]
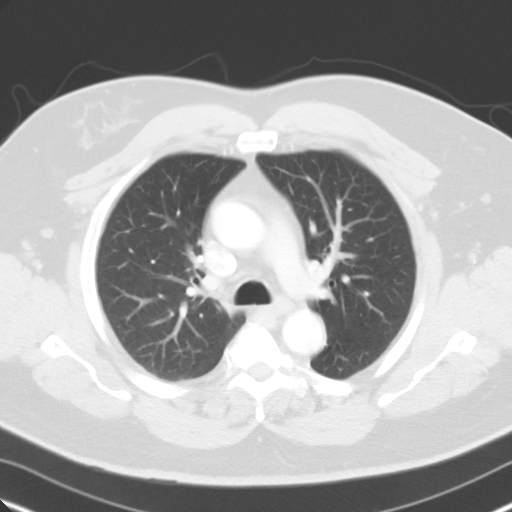
[im 43/62  mediastinal]
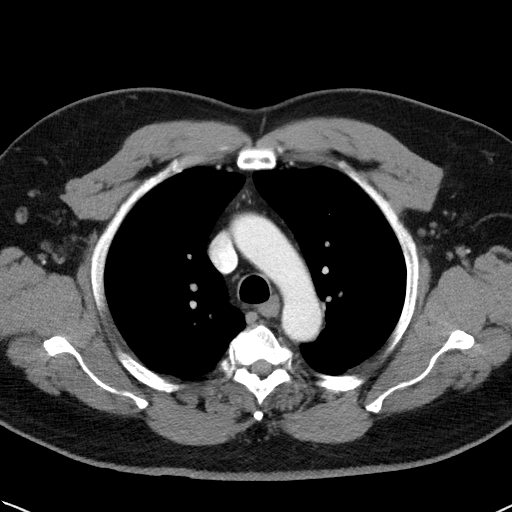
[im 43/62  lung]
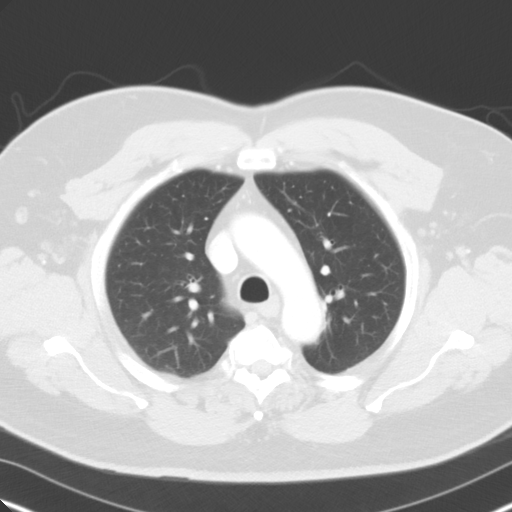
[im 48/62  lung]
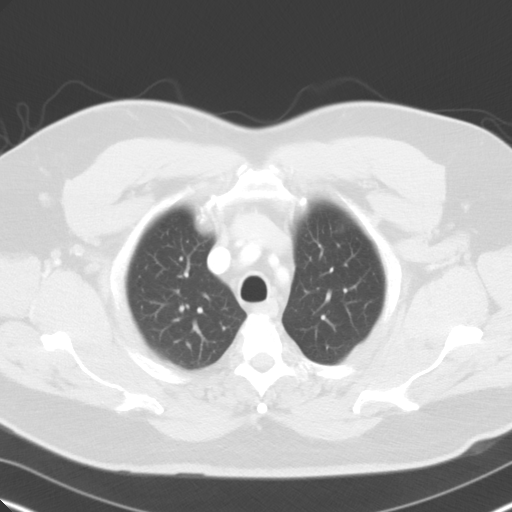
[im 52/62  lung]
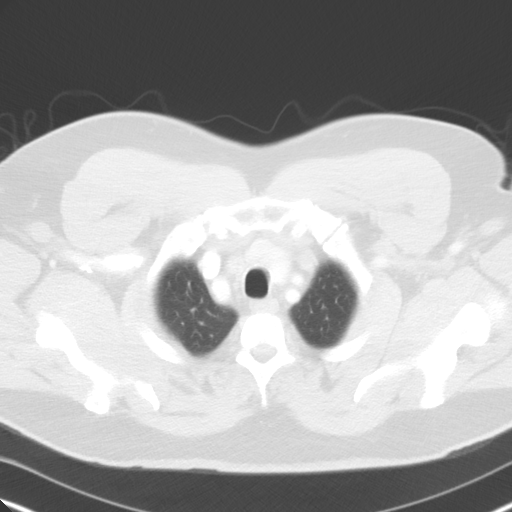
[im 57/62  lung]
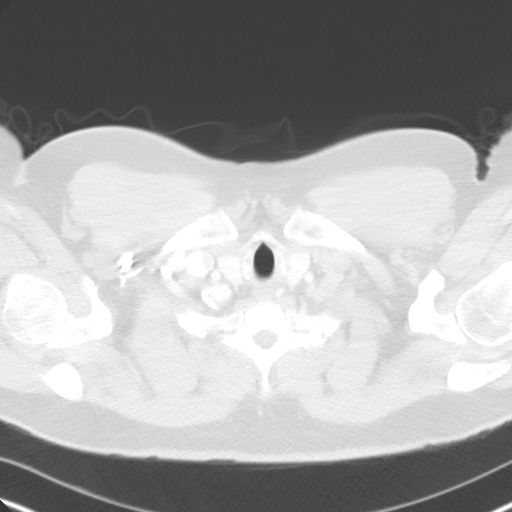

[Series 5: cor routine chest with · coronal · 0.64mm/px · 3 of 160 slices shown]
[im 32/160  lung]
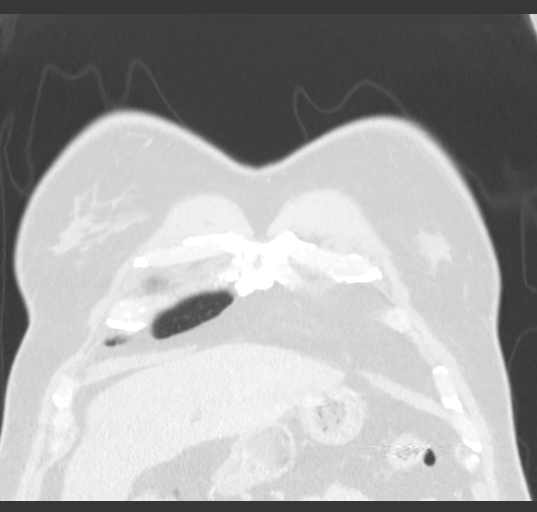
[im 64/160  lung]
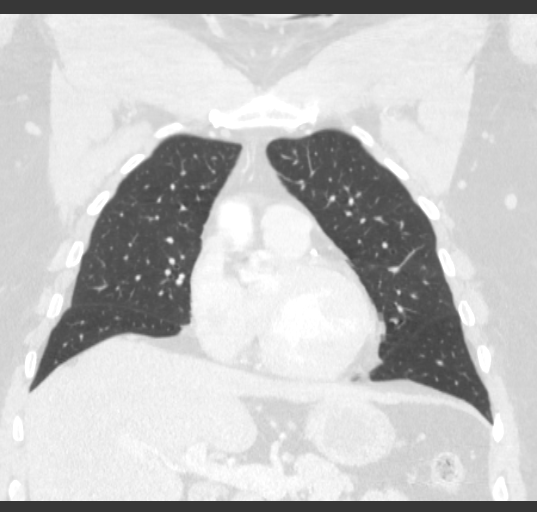
[im 96/160  lung]
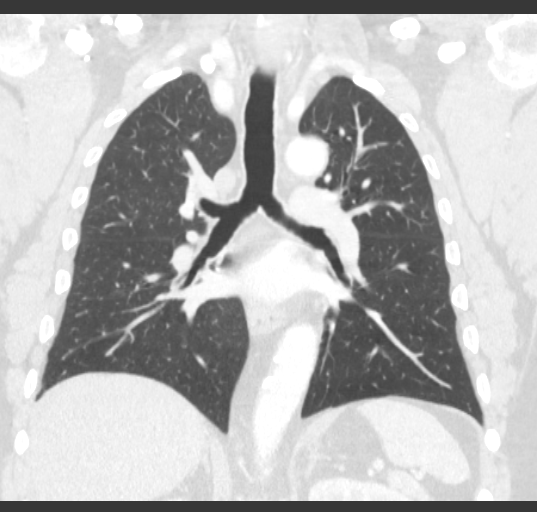

[15 of 36 positions shown; findings below may reference images not displayed]

FINDINGS: Mediastinum/Lymph Nodes: Heart size is normal. There is no
significant pericardial fluid, thickening or pericardial
calcification. There is atherosclerosis of the thoracic aorta, the
great vessels of the mediastinum and the coronary arteries,
including calcified atherosclerotic plaque in the left anterior
descending, left circumflex and right coronary arteries. No
pathologically enlarged mediastinal hilar lymph nodes. Esophagus is
unremarkable in appearance. No axillary lymphadenopathy.

Lungs/Pleura: No acute consolidative airspace disease. No pleural
effusions. No suspicious appearing pulmonary nodules or masses.

Upper Abdomen: Numerous small metallic densities within the
transverse colon incidentally noted. Diffuse low-attenuation in the
visualized hepatic parenchyma (difficult to say for certain on
today's contrast enhanced CT examination).

Musculoskeletal/Soft Tissues: There are no aggressive appearing
lytic or blastic lesions noted in the visualized portions of the
skeleton.
IMPRESSION: 1. No acute findings in the thorax to account for the patient's
symptoms.
2. Atherosclerosis, including three-vessel coronary artery disease.
Please note that although the presence of coronary artery calcium
documents the presence of coronary artery disease, the severity of
this disease and any potential stenosis cannot be assessed on this
non-gated CT examination. Assessment for potential risk factor
modification, dietary therapy or pharmacologic therapy may be
warranted, if clinically indicated.
3. Additional incidental findings, as above.

## 2018-11-21 ENCOUNTER — Ambulatory Visit (INDEPENDENT_AMBULATORY_CARE_PROVIDER_SITE_OTHER): Payer: Non-veteran care

## 2018-11-21 ENCOUNTER — Ambulatory Visit (INDEPENDENT_AMBULATORY_CARE_PROVIDER_SITE_OTHER): Payer: No Typology Code available for payment source | Admitting: Podiatry

## 2018-11-21 ENCOUNTER — Other Ambulatory Visit: Payer: Self-pay | Admitting: Podiatry

## 2018-11-21 DIAGNOSIS — M778 Other enthesopathies, not elsewhere classified: Secondary | ICD-10-CM

## 2018-11-21 DIAGNOSIS — M779 Enthesopathy, unspecified: Secondary | ICD-10-CM

## 2018-11-21 DIAGNOSIS — M722 Plantar fascial fibromatosis: Secondary | ICD-10-CM

## 2018-11-21 MED ORDER — METHYLPREDNISOLONE 4 MG PO TBPK: ORAL_TABLET | ORAL | 0 refills | Status: AC

## 2018-11-21 MED ORDER — MELOXICAM 15 MG PO TABS: 15.0000 mg | ORAL_TABLET | Freq: Every day | ORAL | 1 refills | Status: AC

## 2018-11-24 NOTE — Progress Notes (Signed)
   Subjective: 67 year old male with PMHx of DM presenting today as a new patient with a chief complaint of intermittent sharp, stabbing pain to the bilateral heels that began 4 months ago. He also reports associated pain to the dorsal aspects of the bilateral feet. Walking and standing increases the pain. He states the pain is worse in the morning and when he stands after being seated for a prolonged period of time. He has been using ice therapy and taking Ibuprofen and Flexeril for treatment. Patient is here for further evaluation and treatment.   Past Medical History:  Diagnosis Date  . Arthritis   . Diabetes (HCC)   . Esophageal reflux   . Hypercholesteremia   . Sleep apnea   . Unspecified essential hypertension   . 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 Unspecified urinary incontinence      Objective: Physical Exam General: The patient is alert and oriented x3 in no acute distress.  Dermatology: Skin is warm, dry and supple bilateral lower extremities. Negative for open lesions or macerations bilateral.   Vascular: Dorsalis Pedis and Posterior Tibial pulses palpable bilateral.  Capillary fill time is immediate to all digits.  Neurological: Epicritic and protective threshold intact bilateral.   Musculoskeletal: Tenderness to palpation to the plantar aspect of the bilateral heels along the plantar fascia as well as to the dorsal midfoot bilaterally. All other joints range of motion within normal limits bilateral. Strength 5/5 in all groups bilateral.   Radiographic exam: Normal osseous mineralization. Joint spaces preserved. No fracture/dislocation/boney destruction. No other soft tissue abnormalities or radiopaque foreign bodies.   Assessment: 1. plantar fasciitis bilateral feet 2. Midfoot capsulitis bilateral   Plan of Care:  1. Patient evaluated. Xrays reviewed.   2. Injection of 0.5cc Celestone  soluspan injected into the bilateral heels.  3. Rx for Medrol Dose Pak placed 4. Rx for Meloxicam ordered for patient. 5. Plantar fascial band(s) dispensed for bilateral plantar fasciitis. 6. Instructed patient regarding therapies and modalities at home to alleviate symptoms.  7. Return to clinic in 4 weeks.    Loves to hunt.   Gary Rowland, DPM [facility]  Dr. Felecia ShellingBrent M. Kabe Rowland, DPM    [address]                                   , [place] [address]                Office [phone](336) [phone]  Fax [phone](336) [phone]

## 2018-12-19 ENCOUNTER — Encounter: Payer: Self-pay | Admitting: Podiatry

## 2018-12-19 ENCOUNTER — Ambulatory Visit (INDEPENDENT_AMBULATORY_CARE_PROVIDER_SITE_OTHER): Payer: Medicare Other | Admitting: Podiatry

## 2018-12-19 DIAGNOSIS — M779 Enthesopathy, unspecified: Secondary | ICD-10-CM

## 2018-12-19 DIAGNOSIS — M778 Other enthesopathies, not elsewhere classified: Secondary | ICD-10-CM

## 2018-12-19 DIAGNOSIS — M722 Plantar fascial fibromatosis: Secondary | ICD-10-CM

## 2018-12-21 NOTE — Progress Notes (Signed)
   Subjective: 67 year old male with PMHx of DM presenting today for follow up evaluation of bilateral foot pain. He states his pain has resolved since having the injections at his previous visit. He denies modifying factors or any new complaints at this time. He states he has finished all his medications. Patient is here for further evaluation and treatment.   Past Medical History:  Diagnosis Date  . Arthritis   . Diabetes (HCC)   . Esophageal reflux   . Hypercholesteremia   . Sleep apnea   . Unspecified essential hypertension   . 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 Unspecified urinary incontinence      Objective: Physical Exam General: The patient is alert and oriented x3 in no acute distress.  Dermatology: Skin is warm, dry and supple bilateral lower extremities. Negative for open lesions or macerations bilateral.   Vascular: Dorsalis Pedis and Posterior Tibial pulses palpable bilateral.  Capillary fill time is immediate to all digits.  Neurological: Epicritic and protective threshold intact bilateral.   Musculoskeletal: All other joints range of motion within normal limits bilateral. Strength 5/5 in all groups bilateral.   Assessment: 1. plantar fasciitis bilateral feet - resolved 2. Midfoot capsulitis bilateral - resolved   Plan of Care:  1. Patient evaluated.  2. Continue taking Meloxicam as needed.  3. Recommended good shoe gear.  4. Return to clinic as needed.    Loves to hunt.   Felecia ShellingBrent M. Courtny Bennison, DPM [facility]  Dr. Felecia ShellingBrent M. Veniamin Kincaid, DPM    [address]                                   [place], [place] [address]                Office [phone]) [phone]  Fax [phone](336) [phone]

## 2019-01-12 ENCOUNTER — Telehealth: Payer: Self-pay | Admitting: Podiatry

## 2019-01-12 NOTE — Telephone Encounter (Signed)
Ricky with [facility] called requesting office visit notes from date of service  21 November 2018 be faxed to TEAM 4 at [phone].
